# Patient Record
Sex: Female | Born: 1960 | Race: White | Hispanic: No | Marital: Married | Smoking: Former smoker
Health system: Southern US, Community
[De-identification: ages and names within clinical notes are randomized; demographics above are authoritative.]

## PROBLEM LIST (undated history)

## (undated) DIAGNOSIS — I10 Essential (primary) hypertension: Secondary | ICD-10-CM

## (undated) DIAGNOSIS — J449 Chronic obstructive pulmonary disease, unspecified: Secondary | ICD-10-CM

## (undated) DIAGNOSIS — IMO0002 Reserved for concepts with insufficient information to code with codable children: Secondary | ICD-10-CM

## (undated) DIAGNOSIS — G43909 Migraine, unspecified, not intractable, without status migrainosus: Secondary | ICD-10-CM

## (undated) DIAGNOSIS — E785 Hyperlipidemia, unspecified: Secondary | ICD-10-CM

## (undated) DIAGNOSIS — E079 Disorder of thyroid, unspecified: Secondary | ICD-10-CM

## (undated) DIAGNOSIS — K760 Fatty (change of) liver, not elsewhere classified: Secondary | ICD-10-CM

## (undated) DIAGNOSIS — M1990 Unspecified osteoarthritis, unspecified site: Secondary | ICD-10-CM

## (undated) DIAGNOSIS — K219 Gastro-esophageal reflux disease without esophagitis: Secondary | ICD-10-CM

## (undated) DIAGNOSIS — C801 Malignant (primary) neoplasm, unspecified: Secondary | ICD-10-CM

## (undated) DIAGNOSIS — M2550 Pain in unspecified joint: Secondary | ICD-10-CM

## (undated) DIAGNOSIS — M329 Systemic lupus erythematosus, unspecified: Secondary | ICD-10-CM

## (undated) DIAGNOSIS — J45909 Unspecified asthma, uncomplicated: Secondary | ICD-10-CM

## (undated) HISTORY — PX: TUBAL LIGATION: SHX77

## (undated) HISTORY — PX: SQUAMOUS CELL CARCINOMA EXCISION: SHX2433

## (undated) HISTORY — DX: Essential (primary) hypertension: I10

## (undated) HISTORY — DX: Unspecified osteoarthritis, unspecified site: M19.90

## (undated) HISTORY — PX: TRIGGER FINGER RELEASE: SHX641

## (undated) HISTORY — DX: Pain in unspecified joint: M25.50

## (undated) HISTORY — DX: Gastro-esophageal reflux disease without esophagitis: K21.9

## (undated) HISTORY — DX: Hyperlipidemia, unspecified: E78.5

## (undated) HISTORY — DX: Fatty (change of) liver, not elsewhere classified: K76.0

## (undated) HISTORY — DX: Migraine, unspecified, not intractable, without status migrainosus: G43.909

---

## 2010-09-30 ENCOUNTER — Ambulatory Visit: Payer: Self-pay | Admitting: Physician Assistant

## 2010-10-28 ENCOUNTER — Ambulatory Visit: Payer: Self-pay | Admitting: Physician Assistant

## 2011-01-22 ENCOUNTER — Ambulatory Visit: Payer: Self-pay | Admitting: Adult Health

## 2012-01-12 ENCOUNTER — Ambulatory Visit: Payer: Self-pay | Admitting: "Endocrinology

## 2014-07-05 ENCOUNTER — Ambulatory Visit: Payer: Self-pay | Admitting: Internal Medicine

## 2015-05-22 ENCOUNTER — Other Ambulatory Visit: Payer: Self-pay

## 2015-05-29 ENCOUNTER — Ambulatory Visit: Payer: Self-pay | Admitting: Internal Medicine

## 2015-08-15 ENCOUNTER — Ambulatory Visit: Payer: Self-pay

## 2015-12-11 ENCOUNTER — Other Ambulatory Visit: Payer: Self-pay

## 2015-12-11 LAB — LIPID PANEL
CHOLESTEROL: 225 mg/dL — AB (ref 0–200)
HDL: 62 mg/dL (ref 35–70)
LDL Cholesterol: 139 mg/dL
TRIGLYCERIDES: 118 mg/dL (ref 40–160)

## 2015-12-11 LAB — BASIC METABOLIC PANEL
BUN: 16 mg/dL (ref 4–21)
CREATININE: 0.9 mg/dL (ref 0.5–1.1)
Glucose: 92 mg/dL
Sodium: 147 mmol/L (ref 137–147)

## 2015-12-11 LAB — HEMOGLOBIN A1C: HEMOGLOBIN A1C: 6

## 2015-12-11 LAB — HEPATIC FUNCTION PANEL
ALT: 25 U/L (ref 7–35)
AST: 15 U/L (ref 13–35)
Alkaline Phosphatase: 112 U/L (ref 25–125)
Bilirubin, Total: 0.2 mg/dL

## 2015-12-11 LAB — CBC AND DIFFERENTIAL
NEUTROS ABS: 4 /uL
WBC: 6.5 10^3/mL

## 2015-12-11 LAB — TSH: TSH: 2.95 u[IU]/mL (ref 0.41–5.90)

## 2015-12-18 ENCOUNTER — Ambulatory Visit: Payer: Self-pay | Admitting: Internal Medicine

## 2016-01-01 ENCOUNTER — Ambulatory Visit: Payer: Self-pay | Admitting: Internal Medicine

## 2016-01-01 DIAGNOSIS — J449 Chronic obstructive pulmonary disease, unspecified: Secondary | ICD-10-CM | POA: Insufficient documentation

## 2016-01-01 DIAGNOSIS — J45909 Unspecified asthma, uncomplicated: Secondary | ICD-10-CM | POA: Insufficient documentation

## 2016-01-01 DIAGNOSIS — M329 Systemic lupus erythematosus, unspecified: Secondary | ICD-10-CM | POA: Insufficient documentation

## 2016-01-01 DIAGNOSIS — E039 Hypothyroidism, unspecified: Secondary | ICD-10-CM | POA: Insufficient documentation

## 2016-01-01 DIAGNOSIS — IMO0002 Reserved for concepts with insufficient information to code with codable children: Secondary | ICD-10-CM | POA: Insufficient documentation

## 2016-01-01 DIAGNOSIS — K219 Gastro-esophageal reflux disease without esophagitis: Secondary | ICD-10-CM | POA: Insufficient documentation

## 2016-01-22 ENCOUNTER — Encounter (INDEPENDENT_AMBULATORY_CARE_PROVIDER_SITE_OTHER): Payer: Self-pay

## 2016-01-23 ENCOUNTER — Other Ambulatory Visit: Payer: Self-pay

## 2016-01-28 ENCOUNTER — Other Ambulatory Visit: Payer: Self-pay

## 2016-01-30 ENCOUNTER — Other Ambulatory Visit
Admission: RE | Admit: 2016-01-30 | Discharge: 2016-01-30 | Disposition: A | Discharge status: Home / Self Care | Payer: Self-pay | Source: Ambulatory Visit | Attending: Nurse Practitioner | Admitting: Nurse Practitioner

## 2016-01-30 DIAGNOSIS — Z029 Encounter for administrative examinations, unspecified: Secondary | ICD-10-CM | POA: Insufficient documentation

## 2016-02-03 LAB — QUANTIFERON IN TUBE
QFT TB AG MINUS NIL VALUE: 0.01 IU/mL
QUANTIFERON MITOGEN VALUE: 7.9 IU/mL
QUANTIFERON TB AG VALUE: 0.08 IU/mL
QUANTIFERON TB GOLD: NEGATIVE
Quantiferon Nil Value: 0.07 IU/mL

## 2016-02-03 LAB — QUANTIFERON TB GOLD ASSAY (BLOOD)

## 2016-02-20 ENCOUNTER — Ambulatory Visit: Payer: Self-pay | Admitting: Nurse Practitioner

## 2016-02-20 VITALS — BP 125/71 | HR 81 | Ht 64.0 in | Wt 204.0 lb

## 2016-02-20 DIAGNOSIS — J0100 Acute maxillary sinusitis, unspecified: Secondary | ICD-10-CM

## 2016-02-20 MED ORDER — CEFDINIR 300 MG PO CAPS: 300.0000 mg | ORAL_CAPSULE | Freq: Two times a day (BID) | ORAL | Status: DC

## 2016-02-20 NOTE — Progress Notes (Signed)
   Subjective:    Patient ID: Isabel Jimenez, female    DOB: [birth year], 55 y.o.   MRN: [id number]  HPI Report of purulent nasal drainage x 2 weeks, worsening over the last week, now with productive cough of green sputum.  History of severe asthma, history of lupus, getting ready to start methotrexate, waiting to start until sinus infection is resolved.     Review of Systems  Constitutional: Negative for fever.  HENT: Positive for postnasal drip, rhinorrhea and sinus pressure.   Respiratory: Positive for shortness of breath and wheezing.        Objective:   Physical Exam  HENT:  Positive for r frontal and maxillary sinus pain.  Positive for purulent nasal drainage.  Cardiovascular: Normal rate, regular rhythm and normal heart sounds.   Pulmonary/Chest: She has wheezes.  BBrS, markedly diminished, as expected in long term smoker, with copd and asthma          Assessment & Plan:  Pranjal was seen today for facial pain.  Diagnoses and all orders for this visit:  Acute maxillary sinusitis, recurrence not specified Comments: will treat with cefdinir 300 mg bid for 14 days.  pt encouraged to drink copious fluids, and use saline nasal spray

## 2016-03-03 ENCOUNTER — Encounter: Payer: Self-pay | Admitting: Emergency Medicine

## 2016-03-03 ENCOUNTER — Emergency Department
Admission: EM | Admit: 2016-03-03 | Discharge: 2016-03-03 | Disposition: A | Discharge status: Home / Self Care | Payer: Self-pay | Attending: Emergency Medicine | Admitting: Emergency Medicine

## 2016-03-03 DIAGNOSIS — Z7951 Long term (current) use of inhaled steroids: Secondary | ICD-10-CM | POA: Insufficient documentation

## 2016-03-03 DIAGNOSIS — J449 Chronic obstructive pulmonary disease, unspecified: Secondary | ICD-10-CM | POA: Insufficient documentation

## 2016-03-03 DIAGNOSIS — J029 Acute pharyngitis, unspecified: Secondary | ICD-10-CM | POA: Insufficient documentation

## 2016-03-03 DIAGNOSIS — Z792 Long term (current) use of antibiotics: Secondary | ICD-10-CM | POA: Insufficient documentation

## 2016-03-03 DIAGNOSIS — F17210 Nicotine dependence, cigarettes, uncomplicated: Secondary | ICD-10-CM | POA: Insufficient documentation

## 2016-03-03 DIAGNOSIS — Z79899 Other long term (current) drug therapy: Secondary | ICD-10-CM | POA: Insufficient documentation

## 2016-03-03 HISTORY — DX: Systemic lupus erythematosus, unspecified: M32.9

## 2016-03-03 HISTORY — DX: Disorder of thyroid, unspecified: E07.9

## 2016-03-03 HISTORY — DX: Unspecified asthma, uncomplicated: J45.909

## 2016-03-03 HISTORY — DX: Reserved for concepts with insufficient information to code with codable children: IMO0002

## 2016-03-03 HISTORY — DX: Chronic obstructive pulmonary disease, unspecified: J44.9

## 2016-03-03 LAB — BASIC METABOLIC PANEL
ANION GAP: 6 (ref 5–15)
BUN: 15 mg/dL (ref 6–20)
CO2: 27 mmol/L (ref 22–32)
Calcium: 8.9 mg/dL (ref 8.9–10.3)
Chloride: 106 mmol/L (ref 101–111)
Creatinine, Ser: 0.94 mg/dL (ref 0.44–1.00)
GLUCOSE: 88 mg/dL (ref 65–99)
POTASSIUM: 4.1 mmol/L (ref 3.5–5.1)
SODIUM: 139 mmol/L (ref 135–145)

## 2016-03-03 LAB — CBC
HCT: 40.7 % (ref 35.0–47.0)
Hemoglobin: 13.7 g/dL (ref 12.0–16.0)
MCH: 29.6 pg (ref 26.0–34.0)
MCHC: 33.7 g/dL (ref 32.0–36.0)
MCV: 87.8 fL (ref 80.0–100.0)
PLATELETS: 290 10*3/uL (ref 150–440)
RBC: 4.63 MIL/uL (ref 3.80–5.20)
RDW: 14.4 % (ref 11.5–14.5)
WBC: 5 10*3/uL (ref 3.6–11.0)

## 2016-03-03 NOTE — ED Provider Notes (Signed)
[facility] Emergency Department Provider Note ____________________________________________  Time seen: 1140  I have reviewed the triage vital signs and the nursing notes.  HISTORY  Chief Complaint  Sore Throat  HPI Isabel Jimenez is a 55 y.o. female presents to the ED for evaluation of what she assumes to be a swollen gland the left side of her neck since Thursday. She notes some pain intermittently with swallowing. She denies any interim fevers, chills, sweats. The patient is currently on day 11 of a 14 day course of cefdinir. She is being treated aggressively by primary care provider for sinus infection, prior to starting her methotrexate therapy. She denies any difficulty with swallowing, controlling secretions or breathing difficulty.   Past Medical History  Diagnosis Date  . Lupus ([place])   . COPD (chronic obstructive pulmonary disease) ([place])   . Thyroid disease   . Asthma     There are no active problems to display for this patient.   Past Surgical History  Procedure Laterality Date  . Tubal ligation      Current Outpatient Rx  Name  Route  Sig  Dispense  Refill  . albuterol (PROVENTIL HFA;VENTOLIN HFA) 108 (90 Base) MCG/ACT inhaler   Inhalation   Inhale 1-2 puffs into the lungs every 4 (four) hours as needed for wheezing or shortness of breath.         Marland Kitchen albuterol (PROVENTIL) (2.5 MG/3ML) 0.083% nebulizer solution   Nebulization   Take 2.5 mg by nebulization every 6 (six) hours as needed for wheezing or shortness of breath.         . B Complex Vitamins (B COMPLEX PO)   Oral   Take by mouth daily.         . cefdinir (OMNICEF) 300 MG capsule   Oral   Take 1 capsule (300 mg total) by mouth 2 (two) times daily.   28 capsule   0   . docusate sodium (COLACE) 100 MG capsule   Oral   Take 200 mg by mouth daily.         . fluticasone (FLONASE) 50 MCG/ACT nasal spray   Each Nare   Place 1 spray into both nostrils daily.         .  Fluticasone-Salmeterol (ADVAIR) 500-50 MCG/DOSE AEPB   Inhalation   Inhale 1 puff into the lungs 2 (two) times daily.         . folic acid (FOLVITE) 1 MG tablet   Oral   Take 1 mg by mouth daily.         Marland Kitchen levothyroxine (SYNTHROID, LEVOTHROID) 100 MCG tablet   Oral   Take 100 mcg by mouth daily before breakfast.         . mometasone (ELOCON) 0.1 % ointment   Topical   Apply topically as needed.         Marland Kitchen omeprazole (PRILOSEC) 40 MG capsule   Oral   Take 40 mg by mouth daily.         Marland Kitchen tiotropium (SPIRIVA) 18 MCG inhalation capsule   Inhalation   Place 18 mcg into inhaler and inhale daily.         Marland Kitchen triamcinolone cream (KENALOG) 0.1 %   Topical   Apply 1 application topically as needed.          Allergies Chantix and Codeine  No family history on file.  Social History Social History  Substance Use Topics  . Smoking status: Current Every Day  Smoker -- 0.50 packs/day for 45 years    Types: Cigarettes  . Smokeless tobacco: None  . Alcohol Use: 0.0 oz/week    0 Standard drinks or equivalent per week   Review of Systems  Constitutional: Negative for fever. Eyes: Negative for visual changes. ENT: Positive for sore throat. Cardiovascular: Negative for chest pain. Respiratory: Negative for shortness of breath. Gastrointestinal: Negative for abdominal pain, vomiting and diarrhea. Genitourinary: Negative for dysuria. Musculoskeletal: Negative for back pain. Skin: Negative for rash. Neurological: Negative for headaches, focal weakness or numbness. ____________________________________________  PHYSICAL EXAM:  VITAL SIGNS: ED Triage Vitals  Enc Vitals Group     BP 03/03/16 0946 149/97 mmHg     Pulse Rate 03/03/16 0946 73     Resp 03/03/16 0946 18     Temp 03/03/16 0946 98.6 F (37 C)     Temp src --      SpO2 03/03/16 0946 94 %     Weight 03/03/16 0946 204 lb (92.534 kg)     Height 03/03/16 0946 5\' 4"  (1.626 m)     Head Cir --      Peak Flow --       Pain Score 03/03/16 0946 8     Pain Loc --      Pain Edu? --      Excl. in [place]? --    Constitutional: Alert and oriented. Well appearing and in no distress. Head: Normocephalic and atraumatic.      Eyes: Conjunctivae are normal. PERRL. Normal extraocular movements      Ears: Canals clear. TMs intact bilaterally.   Nose: No congestion/rhinorrhea.   Mouth/Throat: Mucous membranes are moist.   Neck: Supple. No thyromegaly. No soft tissue swelling, edema, or induration. Minimal tenderness to the left lateral neck without fullness or palpable nodule, node, or abscess.  Hematological/Lymphatic/Immunological: No cervical lymphadenopathy. Cardiovascular: Normal rate, regular rhythm.  Respiratory: Normal respiratory effort. No wheezes/rales/rhonchi. Gastrointestinal: Soft and nontender. No distention. Musculoskeletal: Nontender with normal range of motion in all extremities.  Neurologic:  Normal gait without ataxia. Normal speech and language. No gross focal neurologic deficits are appreciated. Skin:  Skin is warm, dry and intact. No rash noted. Psychiatric: Mood and affect are normal. Patient exhibits appropriate insight and judgment. ____________________________________________  INITIAL IMPRESSION / ASSESSMENT AND PLAN / ED COURSE  Patient with intermittent throat pain since starting cefdinir. Her exam is normal without evidence of airway obstruction or angioedema. She is reassured about her symptoms. She will complete the course and follow-up with your provider. She will return to the ED as needed.  ____________________________________________  FINAL CLINICAL IMPRESSION(S) / ED DIAGNOSES  Final diagnoses:  Sore throat      Melvenia Needles, PA-C 03/03/16 1649  Earleen Newport, MD 03/04/16 [phone]

## 2016-03-03 NOTE — Discharge Instructions (Signed)
Sore Throat A sore throat is a painful, burning, sore, or scratchy feeling of the throat. There may be pain or tenderness when swallowing or talking. You may have other symptoms with a sore throat. These include coughing, sneezing, fever, or a swollen neck. A sore throat is often the first sign of another sickness. These sicknesses may include a cold, flu, strep throat, or an infection called mono. Most sore throats go away without medical treatment.  HOME CARE   Only take medicine as told by your doctor.  Drink enough fluids to keep your pee (urine) clear or pale yellow.  Rest as needed.  Try using throat sprays, lozenges, or suck on hard candy (if older than 4 years or as told).  Sip warm liquids, such as broth, herbal tea, or warm water with honey. Try sucking on frozen ice pops or drinking cold liquids.  Rinse the mouth (gargle) with salt water. Mix 1 teaspoon salt with 8 ounces of water.  Do not smoke. Avoid being around others when they are smoking.  Put a humidifier in your bedroom at night to moisten the air. You can also turn on a hot shower and sit in the bathroom for 5-10 minutes. Be sure the bathroom door is closed. GET HELP RIGHT AWAY IF:   You have trouble breathing.  You cannot swallow fluids, soft foods, or your spit (saliva).  You have more puffiness (swelling) in the throat.  Your sore throat does not get better in 7 days.  You feel sick to your stomach (nauseous) and throw up (vomit).  You have a fever or lasting symptoms for more than 2-3 days.  You have a fever and your symptoms suddenly get worse. MAKE SURE YOU:   Understand these instructions.  Will watch your condition.  Will get help right away if you are not doing well or get worse.   This information is not intended to replace advice given to you by your health care provider. Make sure you discuss any questions you have with your health care provider.   Document Released: 09/01/2008 Document  Revised: 08/17/2012 Document Reviewed: 07/31/2012 Elsevier Interactive Patient Education [employer].  Your exam is normal today. You may have a mild case of throat irritation or esophagitis due to your antibiotic. Continue to monitor symptoms. Dose you reflux medicine and Tylenol or Ibuprofen as needed. Follow-up with [facility] as needed.

## 2016-03-03 NOTE — ED Notes (Signed)
See triage note  Placed on antibiotic for sinus infection.  Then developed a swollen gland to left side of neck on Thursday  Feels like it is getting worse. Increased pain with swallowing  Afebrile on arrival

## 2016-03-03 NOTE — ED Notes (Signed)
Pt states she has a swollen node or nodule on the left side of her throat/neck. Was being treated for sinus infection, and feels the antibiotic might be causing the swollen area. States it is painful, pt tearful in triage. Hx of COPD. Cough noted.

## 2016-03-09 DIAGNOSIS — K219 Gastro-esophageal reflux disease without esophagitis: Secondary | ICD-10-CM

## 2016-03-09 DIAGNOSIS — J45909 Unspecified asthma, uncomplicated: Secondary | ICD-10-CM

## 2016-03-09 DIAGNOSIS — M329 Systemic lupus erythematosus, unspecified: Secondary | ICD-10-CM

## 2016-03-09 DIAGNOSIS — IMO0002 Reserved for concepts with insufficient information to code with codable children: Secondary | ICD-10-CM

## 2016-03-09 DIAGNOSIS — J449 Chronic obstructive pulmonary disease, unspecified: Secondary | ICD-10-CM

## 2016-03-09 DIAGNOSIS — E039 Hypothyroidism, unspecified: Secondary | ICD-10-CM

## 2016-03-14 ENCOUNTER — Emergency Department: Payer: Self-pay

## 2016-03-14 ENCOUNTER — Encounter: Payer: Self-pay | Admitting: Medical Oncology

## 2016-03-14 ENCOUNTER — Emergency Department
Admission: EM | Admit: 2016-03-14 | Discharge: 2016-03-14 | Disposition: A | Discharge status: Home / Self Care | Payer: Self-pay | Attending: Emergency Medicine | Admitting: Emergency Medicine

## 2016-03-14 DIAGNOSIS — F17210 Nicotine dependence, cigarettes, uncomplicated: Secondary | ICD-10-CM | POA: Insufficient documentation

## 2016-03-14 DIAGNOSIS — E039 Hypothyroidism, unspecified: Secondary | ICD-10-CM | POA: Insufficient documentation

## 2016-03-14 DIAGNOSIS — J441 Chronic obstructive pulmonary disease with (acute) exacerbation: Secondary | ICD-10-CM | POA: Insufficient documentation

## 2016-03-14 DIAGNOSIS — K30 Functional dyspepsia: Secondary | ICD-10-CM | POA: Insufficient documentation

## 2016-03-14 DIAGNOSIS — I509 Heart failure, unspecified: Secondary | ICD-10-CM | POA: Insufficient documentation

## 2016-03-14 DIAGNOSIS — Z85828 Personal history of other malignant neoplasm of skin: Secondary | ICD-10-CM | POA: Insufficient documentation

## 2016-03-14 HISTORY — DX: Malignant (primary) neoplasm, unspecified: C80.1

## 2016-03-14 LAB — CBC
HCT: 40.4 % (ref 35.0–47.0)
Hemoglobin: 13.6 g/dL (ref 12.0–16.0)
MCH: 29.5 pg (ref 26.0–34.0)
MCHC: 33.7 g/dL (ref 32.0–36.0)
MCV: 87.5 fL (ref 80.0–100.0)
PLATELETS: 285 10*3/uL (ref 150–440)
RBC: 4.61 MIL/uL (ref 3.80–5.20)
RDW: 14.6 % — ABNORMAL HIGH (ref 11.5–14.5)
WBC: 5.5 10*3/uL (ref 3.6–11.0)

## 2016-03-14 LAB — BASIC METABOLIC PANEL
ANION GAP: 7 (ref 5–15)
BUN: 9 mg/dL (ref 6–20)
CALCIUM: 8.1 mg/dL — AB (ref 8.9–10.3)
CO2: 23 mmol/L (ref 22–32)
Chloride: 103 mmol/L (ref 101–111)
Creatinine, Ser: 0.77 mg/dL (ref 0.44–1.00)
Glucose, Bld: 92 mg/dL (ref 65–99)
Potassium: 3.4 mmol/L — ABNORMAL LOW (ref 3.5–5.1)
SODIUM: 133 mmol/L — AB (ref 135–145)

## 2016-03-14 LAB — TROPONIN I

## 2016-03-14 MED ORDER — PREDNISONE 20 MG PO TABS
60.0000 mg | ORAL_TABLET | Freq: Once | ORAL | Status: AC
  Administered 2016-03-14: 60 mg via ORAL
  Filled 2016-03-14: qty 3

## 2016-03-14 MED ORDER — PREDNISONE 10 MG PO TABS: ORAL_TABLET | ORAL | Status: DC

## 2016-03-14 MED ORDER — IPRATROPIUM-ALBUTEROL 0.5-2.5 (3) MG/3ML IN SOLN
9.0000 mL | Freq: Once | RESPIRATORY_TRACT | Status: AC
  Administered 2016-03-14: 9 mL via RESPIRATORY_TRACT
  Filled 2016-03-14: qty 9

## 2016-03-14 MED ORDER — DIPHENHYDRAMINE HCL 25 MG PO CAPS: 50.0000 mg | ORAL_CAPSULE | Freq: Four times a day (QID) | ORAL | Status: DC | PRN

## 2016-03-14 MED ORDER — AZITHROMYCIN 250 MG PO TABS: ORAL_TABLET | ORAL | Status: DC

## 2016-03-14 MED ORDER — IOPAMIDOL (ISOVUE-370) INJECTION 76%
100.0000 mL | Freq: Once | INTRAVENOUS | Status: AC | PRN
  Administered 2016-03-14: 100 mL via INTRAVENOUS

## 2016-03-14 MED ORDER — SODIUM CHLORIDE 0.9 % IV BOLUS (SEPSIS)
1000.0000 mL | Freq: Once | INTRAVENOUS | Status: AC
  Administered 2016-03-14: 1000 mL via INTRAVENOUS

## 2016-03-14 NOTE — Discharge Instructions (Signed)

## 2016-03-14 NOTE — ED Notes (Signed)
Pt reports starting a new medication last week (methotrexate). Ever since starting the medicine she reports it is really affecting her breathing.

## 2016-03-14 NOTE — ED Provider Notes (Signed)
[facility] Emergency Department Provider Note  ____________________________________________  Time seen: 1:00 PM  I have reviewed the triage vital signs and the nursing notes.   HISTORY  Chief Complaint Shortness of Breath    HPI Isabel Jimenez is a 55 y.o. female who complains of shortness of breath this started this morning. Worse with exertion. Feels like her asthma and COPD are both worse.  She took her second dose of methotrexate which she is prescribed for lupus yesterday. Last week was her first dose, and that they following the administration she felt more short of breath. This was short-lived and resolved on its own. However, since yesterday her breathing feels much worse again. Denies any skin lesions or rash, throat swelling, vomiting. Symptoms are isolated to shortness of breath and wheezing. Has a nonproductive cough as well. Also reports some central aching chest pain that is nonradiating and hurts worse with breathing and coughing. Denies a history of DVT or PE or recent travel, hospitalizations or surgeries.     Past Medical History  Diagnosis Date  . Lupus ([place])   . COPD (chronic obstructive pulmonary disease) ([place])   . Thyroid disease   . Asthma   . CHF (congestive heart failure) ([place])   . Migraines   . Joint pain   . Cancer [facility])     skin     Patient Active Problem List   Diagnosis Date Noted  . Lupus ([place]) 01/01/2016  . Hypothyroidism 01/01/2016  . Acid reflux 01/01/2016  . COPD (chronic obstructive pulmonary disease) ([place]) 01/01/2016  . Asthma 01/01/2016     Past Surgical History  Procedure Laterality Date  . Tubal ligation    . Squamous cell carcinoma excision      Left Shoulder     Current Outpatient Rx  Name  Route  Sig  Dispense  Refill  . albuterol (PROVENTIL HFA;VENTOLIN HFA) 108 (90 Base) MCG/ACT inhaler   Inhalation   Inhale 1-2 puffs into the lungs every 4 (four) hours as needed for wheezing or shortness  of breath.         Marland Kitchen albuterol (PROVENTIL) (2.5 MG/3ML) 0.083% nebulizer solution   Nebulization   Take 2.5 mg by nebulization every 6 (six) hours as needed for wheezing or shortness of breath.         Marland Kitchen azithromycin (ZITHROMAX Z-PAK) 250 MG tablet      Take 2 tablets (500 mg) on  Day 1,  followed by 1 tablet (250 mg) once daily on Days 2 through 5.   6 each   0   . B Complex Vitamins (B COMPLEX PO)   Oral   Take by mouth daily.         . cefdinir (OMNICEF) 300 MG capsule   Oral   Take 1 capsule (300 mg total) by mouth 2 (two) times daily.   28 capsule   0   . diphenhydrAMINE (BENADRYL) 25 mg capsule   Oral   Take 2 capsules (50 mg total) by mouth every 6 (six) hours as needed.   60 capsule   0   . docusate sodium (COLACE) 100 MG capsule   Oral   Take 200 mg by mouth daily.         . fluticasone (FLONASE) 50 MCG/ACT nasal spray   Each Nare   Place 1 spray into both nostrils daily.         . Fluticasone-Salmeterol (ADVAIR) 500-50 MCG/DOSE AEPB   Inhalation  Inhale 1 puff into the lungs 2 (two) times daily.         . folic acid (FOLVITE) 1 MG tablet   Oral   Take 1 mg by mouth daily.         Marland Kitchen levothyroxine (SYNTHROID, LEVOTHROID) 100 MCG tablet   Oral   Take 100 mcg by mouth daily before breakfast.         . mometasone (ELOCON) 0.1 % ointment   Topical   Apply topically as needed.         Marland Kitchen omeprazole (PRILOSEC) 40 MG capsule   Oral   Take 40 mg by mouth daily.         . predniSONE (DELTASONE) 10 MG tablet      Take five tablets a day (50 mg) for 3 days,  Then four tablets a day (40 mg) for 3 days  Then three tablets a day (30 mg) for 3 days  Then two tablets a day (20 mg) for 5 days  Then one tablet a day (10 mg) for 5 days   51 tablet   0   . tiotropium (SPIRIVA) 18 MCG inhalation capsule   Inhalation   Place 18 mcg into inhaler and inhale daily.         Marland Kitchen triamcinolone cream (KENALOG) 0.1 %   Topical   Apply 1  application topically as needed.            Allergies Cefdinir; Chantix; and Codeine   Family History  Problem Relation Age of Onset  . Cancer Mother     Breast  . Cancer Father     Colon  . Renal Disease Father     Social History Social History  Substance Use Topics  . Smoking status: Current Every Day Smoker -- 0.50 packs/day for 45 years    Types: Cigarettes  . Smokeless tobacco: None  . Alcohol Use: 0.0 oz/week    0 Standard drinks or equivalent per week    Review of Systems  Constitutional:   No fever or chills. No weight changes Eyes:   No vision changes.  ENT:   No sore throat. No rhinorrhea. Cardiovascular:   Positive as above chest pain. Respiratory:   Shortness of breath and nonproductive cough. Gastrointestinal:   Negative for abdominal pain, vomiting and diarrhea.  No BRBPR or melena. Genitourinary:   Negative for dysuria or difficulty urinating. Musculoskeletal:   Negative for focal pain or swelling Skin:   Negative for rash. Neurological:   Negative for headaches, focal weakness or numbness.  10-point ROS otherwise negative.  ____________________________________________   PHYSICAL EXAM:  VITAL SIGNS: ED Triage Vitals  Enc Vitals Group     BP 03/14/16 1045 163/85 mmHg     Pulse Rate 03/14/16 1045 85     Resp 03/14/16 1045 22     Temp 03/14/16 1045 98.1 F (36.7 C)     Temp Source 03/14/16 1045 Oral     SpO2 03/14/16 1045 91 %     Weight 03/14/16 1045 204 lb (92.534 kg)     Height --      Head Cir --      Peak Flow --      Pain Score 03/14/16 1045 0     Pain Loc --      Pain Edu? --      Excl. in [place]? --     Vital signs reviewed, nursing assessments reviewed.   Constitutional:   Alert and oriented.  Well appearing and in no distress. Good spirits Eyes:   No scleral icterus. No conjunctival pallor. PERRL. EOMI ENT   Head:   Normocephalic and atraumatic.   Nose:   No congestion/rhinnorhea. No septal hematoma    Mouth/Throat:   MMM, mild pharyngeal erythema. No peritonsillar mass.    Neck:   No stridor. No SubQ emphysema. No meningismus. Hematological/Lymphatic/Immunilogical:   No cervical lymphadenopathy. Cardiovascular:   RRR. Symmetric bilateral radial and DP pulses.  No murmurs.  Respiratory:   Diffuse expiratory wheezing. Mildly prolonged expiratory phase. No focal consolidative findings.. Gastrointestinal:   Soft and nontender. Non distended. There is no CVA tenderness.  No rebound, rigidity, or guarding. Genitourinary:   deferred Musculoskeletal:   Nontender with normal range of motion in all extremities. No joint effusions.  No lower extremity tenderness.  No edema. Neurologic:   Normal speech and language.  CN 2-10 normal. Motor grossly intact. No gross focal neurologic deficits are appreciated.  Skin:    Skin is warm, dry and intact. No rash noted.  No petechiae, purpura, or bullae. Psychiatric:   Mood and affect are normal. ____________________________________________    LABS (pertinent positives/negatives) (all labs ordered are listed, but only abnormal results are displayed) Labs Reviewed  BASIC METABOLIC PANEL - Abnormal; Notable for the following:    Sodium 133 (*)    Potassium 3.4 (*)    Calcium 8.1 (*)    All other components within normal limits  CBC - Abnormal; Notable for the following:    RDW 14.6 (*)    All other components within normal limits  TROPONIN I   ____________________________________________   EKG  Interpreted by me  Date: 03/14/2016  Rate: 70  Rhythm: normal sinus rhythm  QRS Axis: normal  Intervals: normal  ST/T Wave abnormalities: normal  Conduction Disutrbances: none  Narrative Interpretation: unremarkable      ____________________________________________    RADIOLOGY  Chest x-ray unremarkable CT angiogram of the chest  unremarkable  ____________________________________________   PROCEDURES   ____________________________________________   INITIAL IMPRESSION / ASSESSMENT AND PLAN / ED COURSE  Pertinent labs & imaging results that were available during my care of the patient were reviewed by me and considered in my medical decision making (see chart for details).  Patient presents with shortness of breath and wheezing. With lupus patient is at an elevated risk of PE so with the shortness of breath and started chest pain we proceeded with a CT angiogram of the chest. This was unremarkable. The patient does not have hypoxia and other vital signs are unremarkable. This appears to be COPD exacerbation. She's feeling a little bit better after steroids and nebs. She has nebulizer at home as well. Low suspicion for dissection or ACS. No evidence of pneumonia or sepsis at present. We'll continue the patient on a steroid taper and continue bronchodilators at home. Also start her on azithromycin due to the immune suppression that she has with steroids and methotrexate and lupus. Current presentation not consistent with anaphylaxis but I have instructed her to stop taking methotrexate as she may be having some degree of allergic reaction to it. We'll also have her take Benadryl for symptom control.       ____________________________________________   FINAL CLINICAL IMPRESSION(S) / ED DIAGNOSES  Final diagnoses:  COPD with acute exacerbation ([place])      Carrie Mew, MD 03/14/16 [phone]

## 2016-03-14 NOTE — ED Notes (Signed)
Patient has saturation of 91% on RA

## 2016-03-14 NOTE — ED Notes (Signed)
Pt reports she began having sob this am, denies chest pain. Pt appears anxious in triage, pt is tearful. Pt reports she was started on methotrexate recently for lupus and she took a dose last night and thinks this could be due to new med. Pt was placed on O2 2L in triage, RA sats were 91%.

## 2016-04-07 ENCOUNTER — Encounter: Payer: Self-pay | Admitting: Emergency Medicine

## 2016-04-07 ENCOUNTER — Emergency Department: Payer: Self-pay

## 2016-04-07 ENCOUNTER — Emergency Department
Admission: EM | Admit: 2016-04-07 | Discharge: 2016-04-07 | Disposition: A | Discharge status: Home / Self Care | Payer: Self-pay | Attending: Emergency Medicine | Admitting: Emergency Medicine

## 2016-04-07 DIAGNOSIS — I509 Heart failure, unspecified: Secondary | ICD-10-CM | POA: Insufficient documentation

## 2016-04-07 DIAGNOSIS — J441 Chronic obstructive pulmonary disease with (acute) exacerbation: Secondary | ICD-10-CM | POA: Insufficient documentation

## 2016-04-07 DIAGNOSIS — J45909 Unspecified asthma, uncomplicated: Secondary | ICD-10-CM | POA: Insufficient documentation

## 2016-04-07 DIAGNOSIS — Z85828 Personal history of other malignant neoplasm of skin: Secondary | ICD-10-CM | POA: Insufficient documentation

## 2016-04-07 DIAGNOSIS — F17210 Nicotine dependence, cigarettes, uncomplicated: Secondary | ICD-10-CM | POA: Insufficient documentation

## 2016-04-07 DIAGNOSIS — Z79899 Other long term (current) drug therapy: Secondary | ICD-10-CM | POA: Insufficient documentation

## 2016-04-07 DIAGNOSIS — E039 Hypothyroidism, unspecified: Secondary | ICD-10-CM | POA: Insufficient documentation

## 2016-04-07 LAB — BASIC METABOLIC PANEL
Anion gap: 8 (ref 5–15)
BUN: 17 mg/dL (ref 6–20)
CO2: 27 mmol/L (ref 22–32)
CREATININE: 0.85 mg/dL (ref 0.44–1.00)
Calcium: 8.8 mg/dL — ABNORMAL LOW (ref 8.9–10.3)
Chloride: 107 mmol/L (ref 101–111)
GFR calc Af Amer: >60 mL/min (ref >60)
Glucose, Bld: 99 mg/dL (ref 65–99)
Potassium: 3.8 mmol/L (ref 3.5–5.1)
SODIUM: 142 mmol/L (ref 135–145)

## 2016-04-07 LAB — BRAIN NATRIURETIC PEPTIDE: B NATRIURETIC PEPTIDE 5: 23 pg/mL (ref 0.0–100.0)

## 2016-04-07 LAB — CBC
HEMATOCRIT: 40.1 % (ref 35.0–47.0)
HEMOGLOBIN: 13.4 g/dL (ref 12.0–16.0)
MCH: 29.5 pg (ref 26.0–34.0)
MCHC: 33.5 g/dL (ref 32.0–36.0)
MCV: 88.1 fL (ref 80.0–100.0)
Platelets: 273 10*3/uL (ref 150–440)
RBC: 4.55 MIL/uL (ref 3.80–5.20)
RDW: 14.7 % — ABNORMAL HIGH (ref 11.5–14.5)
WBC: 6 10*3/uL (ref 3.6–11.0)

## 2016-04-07 MED ORDER — AZITHROMYCIN 250 MG PO TABS: 500.0000 mg | ORAL_TABLET | Freq: Once | ORAL | Status: DC

## 2016-04-07 MED ORDER — PREDNISONE 20 MG PO TABS: 60.0000 mg | ORAL_TABLET | Freq: Every day | ORAL | Status: DC

## 2016-04-07 MED ORDER — AZITHROMYCIN 500 MG PO TABS
500.0000 mg | ORAL_TABLET | Freq: Once | ORAL | Status: AC
  Administered 2016-04-07: 500 mg via ORAL
  Filled 2016-04-07: qty 1

## 2016-04-07 MED ORDER — METHYLPREDNISOLONE SODIUM SUCC 125 MG IJ SOLR
125.0000 mg | Freq: Once | INTRAMUSCULAR | Status: AC
  Administered 2016-04-07: 125 mg via INTRAVENOUS
  Filled 2016-04-07: qty 2

## 2016-04-07 MED ORDER — IPRATROPIUM-ALBUTEROL 0.5-2.5 (3) MG/3ML IN SOLN
3.0000 mL | Freq: Once | RESPIRATORY_TRACT | Status: AC
  Administered 2016-04-07: 3 mL via RESPIRATORY_TRACT
  Filled 2016-04-07: qty 3

## 2016-04-07 NOTE — Discharge Instructions (Signed)
Please continue to use your albuterol nebulizer and your albuterol inhaler with spacer as needed. Take the entire course of steroids and antibiotics, even if you're feeling better. Please make an appointment with your primary care physician for close follow-up.  Return to the emergency department if you develop worsening shortness of breath, fever, lightheadedness or fainting, chest pain, or any other symptoms concerning to you.

## 2016-04-07 NOTE — ED Notes (Signed)
Pt states she wears 2 L at night, but for the past few days she has been wearing 3L continuously.  Pt states she has used nebs and inhaler lately, neb has been helping more than the inhaler.

## 2016-04-07 NOTE — ED Provider Notes (Signed)
[facility] Emergency Department Provider Note  ____________________________________________  Time seen: Approximately 9:51 AM  I have reviewed the triage vital signs and the nursing notes.   HISTORY  Chief Complaint Cough    HPI Isabel Jimenez is a 55 y.o. female with a history of COPD and asthma as well as ongoing tobacco abuse, CHF, presenting for shortness of breath. The patient reports that at baseline she wears 2 L nasal cannula at night. Over the past week, she has had progressively worsening shortness of breath and is now wearing oxygen during the day occasionally as well as using 3 L nasal cannula at night. She has worsening shortness of breath with exertion. She has had a nonproductive cough with some mild congestion but no rhinorrhea, sore throat or ear pain. No fever or chills. She is not had any chest pain, palpitations, or lower extremity swelling. Her symptoms improved with the albuterol nebulizer.   Past Medical History  Diagnosis Date  . Lupus ([place])   . COPD (chronic obstructive pulmonary disease) ([place])   . Thyroid disease   . Asthma   . CHF (congestive heart failure) ([place])   . Migraines   . Joint pain   . Cancer [facility])     skin    Patient Active Problem List   Diagnosis Date Noted  . Lupus ([place]) 01/01/2016  . Hypothyroidism 01/01/2016  . Acid reflux 01/01/2016  . COPD (chronic obstructive pulmonary disease) ([place]) 01/01/2016  . Asthma 01/01/2016    Past Surgical History  Procedure Laterality Date  . Tubal ligation    . Squamous cell carcinoma excision      Left Shoulder    Current Outpatient Rx  Name  Route  Sig  Dispense  Refill  . albuterol (PROVENTIL HFA;VENTOLIN HFA) 108 (90 Base) MCG/ACT inhaler   Inhalation   Inhale 1-2 puffs into the lungs every 4 (four) hours as needed for wheezing or shortness of breath.         Marland Kitchen albuterol (PROVENTIL) (2.5 MG/3ML) 0.083% nebulizer solution   Nebulization   Take 2.5 mg by  nebulization every 6 (six) hours as needed for wheezing or shortness of breath.         Marland Kitchen azithromycin (ZITHROMAX) 250 MG tablet   Oral   Take 2 tablets (500 mg total) by mouth once.   4 tablet   0   . B Complex Vitamins (B COMPLEX PO)   Oral   Take by mouth daily.         . cefdinir (OMNICEF) 300 MG capsule   Oral   Take 1 capsule (300 mg total) by mouth 2 (two) times daily.   28 capsule   0   . diphenhydrAMINE (BENADRYL) 25 mg capsule   Oral   Take 2 capsules (50 mg total) by mouth every 6 (six) hours as needed.   60 capsule   0   . docusate sodium (COLACE) 100 MG capsule   Oral   Take 200 mg by mouth daily.         . fluticasone (FLONASE) 50 MCG/ACT nasal spray   Each Nare   Place 1 spray into both nostrils daily.         . Fluticasone-Salmeterol (ADVAIR) 500-50 MCG/DOSE AEPB   Inhalation   Inhale 1 puff into the lungs 2 (two) times daily.         . folic acid (FOLVITE) 1 MG tablet   Oral   Take 1 mg by  mouth daily.         Marland Kitchen levothyroxine (SYNTHROID, LEVOTHROID) 100 MCG tablet   Oral   Take 100 mcg by mouth daily before breakfast.         . mometasone (ELOCON) 0.1 % ointment   Topical   Apply topically as needed.         Marland Kitchen omeprazole (PRILOSEC) 40 MG capsule   Oral   Take 40 mg by mouth daily.         . predniSONE (DELTASONE) 20 MG tablet   Oral   Take 3 tablets (60 mg total) by mouth daily.   15 tablet   0   . tiotropium (SPIRIVA) 18 MCG inhalation capsule   Inhalation   Place 18 mcg into inhaler and inhale daily.         Marland Kitchen triamcinolone cream (KENALOG) 0.1 %   Topical   Apply 1 application topically as needed.           Allergies Methotrexate derivatives; Cefdinir; Chantix; Codeine; and Plaquenil  Family History  Problem Relation Age of Onset  . Cancer Mother     Breast  . Cancer Father     Colon  . Renal Disease Father     Social History Social History  Substance Use Topics  . Smoking status: Current Every  Day Smoker -- 0.50 packs/day for 45 years    Types: Cigarettes  . Smokeless tobacco: None  . Alcohol Use: 0.0 oz/week    0 Standard drinks or equivalent per week    Review of Systems Constitutional: No fever/chills. No lightheadedness or syncope. Eyes: No visual changes. ENT: No sore throat. Positive congestion without rhinorrhea. Cardiovascular: Denies chest pain. Denies palpitations. Respiratory: Positive shortness of breath.  Positive nonproductive cough. Gastrointestinal: No abdominal pain.  No nausea, no vomiting.  No diarrhea.  No constipation. Genitourinary: Negative for dysuria. Musculoskeletal: Negative for back pain. No lower extremity swelling. No calf pain. Skin: Negative for rash. Neurological: Negative for headaches. No focal numbness, tingling or weakness.   10-point ROS otherwise negative.  ____________________________________________   PHYSICAL EXAM:  VITAL SIGNS: ED Triage Vitals  Enc Vitals Group     BP 04/07/16 0935 129/77 mmHg     Pulse Rate 04/07/16 0935 80     Resp 04/07/16 0935 20     Temp 04/07/16 0935 97.9 F (36.6 C)     Temp Source 04/07/16 0935 Oral     SpO2 04/07/16 0935 87 %     Weight 04/07/16 0935 199 lb (90.266 kg)     Height 04/07/16 0935 5\' 4"  (1.626 m)     Head Cir --      Peak Flow --      Pain Score 04/07/16 0935 4     Pain Loc --      Pain Edu? --      Excl. in [place]? --     Constitutional: Alert and oriented. Chronically ill appearing and in no acute distress. Answers questions appropriately. Eyes: Conjunctivae are normal.  EOMI. No scleral icterus. Head: Atraumatic. Nose: No congestion/rhinnorhea. Mouth/Throat: Mucous membranes are moist.  Neck: No stridor.  Supple.  No JVD. Cardiovascular: Normal rate, regular rhythm. No murmurs, rubs or gallops.  Respiratory: Mild tachypnea without accessory muscle use or retractions. Able to speak in full sentences. Mild end expiratory wheezing with fair air exchange in the bilateral lung  fields. No rales or rhonchi. Gastrointestinal: Soft, nontender and nondistended.  No guarding or rebound.  No peritoneal  signs. Musculoskeletal: No LE edema. No ttp in the calves or palpable cords.  Negative Homan's sign. Neurologic:  A&Ox3.  Speech is clear.  Face and smile are symmetric.  EOMI.  Moves all extremities well. Skin:  Skin is warm, dry and intact. No rash noted. Psychiatric: Mood and affect are normal. Speech and behavior are normal.  Normal judgement.  ____________________________________________   LABS (all labs ordered are listed, but only abnormal results are displayed)  Labs Reviewed  CBC - Abnormal; Notable for the following:    RDW 14.7 (*)    All other components within normal limits  BASIC METABOLIC PANEL - Abnormal; Notable for the following:    Calcium 8.8 (*)    All other components within normal limits  BRAIN NATRIURETIC PEPTIDE   ____________________________________________  EKG  ED ECG REPORT I, Eula Listen, the attending physician, personally viewed and interpreted this ECG.   Date: 04/07/2016  EKG Time: 1005  Rate: 69  Rhythm: normal sinus rhythm  Axis: normal  Intervals:none  ST&T Change: Nonspecific T-wave inversion in V1. No ST elevation. Positive PVC.  ____________________________________________  RADIOLOGY  Dg Chest 2 View  04/07/2016  CLINICAL DATA:  Shortness of breath, productive cough EXAM: CHEST  2 VIEW COMPARISON:  03/14/2016 FINDINGS: Cardiomediastinal silhouette is stable. No acute infiltrate or pleural effusion. No pulmonary edema. Stable mild degenerative changes mid thoracic spine. IMPRESSION: No active cardiopulmonary disease. Electronically Signed   By: Lahoma Crocker M.D.   On: 04/07/2016 10:49    ____________________________________________   PROCEDURES  Procedure(s) performed: None  Critical Care performed: No ____________________________________________   INITIAL IMPRESSION / ASSESSMENT AND PLAN / ED  COURSE  Pertinent labs & imaging results that were available during my care of the patient were reviewed by me and considered in my medical decision making (see chart for details).  55 y.o. with a history of COPD and asthma as well as ongoing tobacco abuse, CHF, presenting with 1 week of progressively worsening shortness of breath, decreased exercise tolerance, and nonproductive cough. The most likely etiology of the patient's symptom is a COPD exacerbation. I will get a chest x-ray to rule out pneumonia but the patient has not been having systemic symptoms that would be suggestive of this. PE is much less likely. I will give the patient a breathing treatment, steroids and antibiotics, and reevaluate her.  ----------------------------------------- 11:03 AM on 04/07/2016 -----------------------------------------  The patient is feeling better and has maintain oxygen saturation of greater than 95% on 2 L nasal cannula. Her chest x-ray does not show pneumonia. Will plan to discharge her home with treatment for COPD exacerbation, with close PMD follow-up. She understand return precautions as well as follow-up instructions.  ____________________________________________  FINAL CLINICAL IMPRESSION(S) / ED DIAGNOSES  Final diagnoses:  COPD exacerbation ([place])      NEW MEDICATIONS STARTED DURING THIS VISIT:  New Prescriptions   AZITHROMYCIN (ZITHROMAX) 250 MG TABLET    Take 2 tablets (500 mg total) by mouth once.   PREDNISONE (DELTASONE) 20 MG TABLET    Take 3 tablets (60 mg total) by mouth daily.     Eula Listen, MD 04/07/16 1103

## 2016-04-07 NOTE — ED Notes (Signed)
Pt to ed with c/o sob, cough x several days.  Pt states coughing up white sputum.

## 2016-04-17 ENCOUNTER — Encounter: Payer: Self-pay | Admitting: Emergency Medicine

## 2016-04-17 ENCOUNTER — Emergency Department: Payer: Self-pay

## 2016-04-17 ENCOUNTER — Other Ambulatory Visit: Payer: Self-pay

## 2016-04-17 ENCOUNTER — Inpatient Hospital Stay
Admission: EM | Admit: 2016-04-17 | Discharge: 2016-04-20 | DRG: 190 | Disposition: A | Discharge status: Home / Self Care | Payer: Self-pay | Attending: Internal Medicine | Admitting: Internal Medicine

## 2016-04-17 DIAGNOSIS — E039 Hypothyroidism, unspecified: Secondary | ICD-10-CM | POA: Diagnosis present

## 2016-04-17 DIAGNOSIS — Z9981 Dependence on supplemental oxygen: Secondary | ICD-10-CM

## 2016-04-17 DIAGNOSIS — Z79899 Other long term (current) drug therapy: Secondary | ICD-10-CM

## 2016-04-17 DIAGNOSIS — Z800 Family history of malignant neoplasm of digestive organs: Secondary | ICD-10-CM

## 2016-04-17 DIAGNOSIS — R739 Hyperglycemia, unspecified: Secondary | ICD-10-CM | POA: Diagnosis present

## 2016-04-17 DIAGNOSIS — Z841 Family history of disorders of kidney and ureter: Secondary | ICD-10-CM

## 2016-04-17 DIAGNOSIS — Z885 Allergy status to narcotic agent status: Secondary | ICD-10-CM

## 2016-04-17 DIAGNOSIS — J9621 Acute and chronic respiratory failure with hypoxia: Secondary | ICD-10-CM | POA: Diagnosis present

## 2016-04-17 DIAGNOSIS — M329 Systemic lupus erythematosus, unspecified: Secondary | ICD-10-CM | POA: Diagnosis present

## 2016-04-17 DIAGNOSIS — I509 Heart failure, unspecified: Secondary | ICD-10-CM | POA: Diagnosis present

## 2016-04-17 DIAGNOSIS — K219 Gastro-esophageal reflux disease without esophagitis: Secondary | ICD-10-CM | POA: Diagnosis present

## 2016-04-17 DIAGNOSIS — Z716 Tobacco abuse counseling: Secondary | ICD-10-CM

## 2016-04-17 DIAGNOSIS — J441 Chronic obstructive pulmonary disease with (acute) exacerbation: Secondary | ICD-10-CM | POA: Diagnosis present

## 2016-04-17 DIAGNOSIS — F17210 Nicotine dependence, cigarettes, uncomplicated: Secondary | ICD-10-CM | POA: Diagnosis present

## 2016-04-17 DIAGNOSIS — F419 Anxiety disorder, unspecified: Secondary | ICD-10-CM | POA: Diagnosis present

## 2016-04-17 DIAGNOSIS — Z803 Family history of malignant neoplasm of breast: Secondary | ICD-10-CM

## 2016-04-17 DIAGNOSIS — Z888 Allergy status to other drugs, medicaments and biological substances status: Secondary | ICD-10-CM

## 2016-04-17 DIAGNOSIS — J209 Acute bronchitis, unspecified: Secondary | ICD-10-CM | POA: Diagnosis present

## 2016-04-17 DIAGNOSIS — R0602 Shortness of breath: Secondary | ICD-10-CM

## 2016-04-17 DIAGNOSIS — J440 Chronic obstructive pulmonary disease with acute lower respiratory infection: Principal | ICD-10-CM | POA: Diagnosis present

## 2016-04-17 DIAGNOSIS — J449 Chronic obstructive pulmonary disease, unspecified: Secondary | ICD-10-CM

## 2016-04-17 DIAGNOSIS — J45909 Unspecified asthma, uncomplicated: Secondary | ICD-10-CM | POA: Diagnosis present

## 2016-04-17 DIAGNOSIS — T380X5A Adverse effect of glucocorticoids and synthetic analogues, initial encounter: Secondary | ICD-10-CM | POA: Diagnosis present

## 2016-04-17 DIAGNOSIS — E079 Disorder of thyroid, unspecified: Secondary | ICD-10-CM | POA: Diagnosis present

## 2016-04-17 DIAGNOSIS — Z7952 Long term (current) use of systemic steroids: Secondary | ICD-10-CM

## 2016-04-17 LAB — BASIC METABOLIC PANEL
ANION GAP: 8 (ref 5–15)
BUN: 17 mg/dL (ref 6–20)
CALCIUM: 8.8 mg/dL — AB (ref 8.9–10.3)
CO2: 28 mmol/L (ref 22–32)
Chloride: 105 mmol/L (ref 101–111)
Creatinine, Ser: 0.67 mg/dL (ref 0.44–1.00)
GFR calc Af Amer: >60 mL/min (ref >60)
Glucose, Bld: 119 mg/dL — ABNORMAL HIGH (ref 65–99)
POTASSIUM: 3.9 mmol/L (ref 3.5–5.1)
SODIUM: 141 mmol/L (ref 135–145)

## 2016-04-17 LAB — TROPONIN I

## 2016-04-17 LAB — CBC
HCT: 42.3 % (ref 35.0–47.0)
Hemoglobin: 14.1 g/dL (ref 12.0–16.0)
MCH: 30.1 pg (ref 26.0–34.0)
MCHC: 33.3 g/dL (ref 32.0–36.0)
MCV: 90.4 fL (ref 80.0–100.0)
Platelets: 269 10*3/uL (ref 150–440)
RBC: 4.68 MIL/uL (ref 3.80–5.20)
RDW: 14.5 % (ref 11.5–14.5)
WBC: 8.2 10*3/uL (ref 3.6–11.0)

## 2016-04-17 LAB — GLUCOSE, CAPILLARY: GLUCOSE-CAPILLARY: 165 mg/dL — AB (ref 65–99)

## 2016-04-17 MED ORDER — METHYLPREDNISOLONE SODIUM SUCC 125 MG IJ SOLR
INTRAMUSCULAR | Status: AC
  Administered 2016-04-17: 125 mg via INTRAVENOUS
  Filled 2016-04-17: qty 2

## 2016-04-17 MED ORDER — LEVOTHYROXINE SODIUM 100 MCG PO TABS
100.0000 ug | ORAL_TABLET | Freq: Every day | ORAL | Status: DC
  Administered 2016-04-18 – 2016-04-20 (×3): 100 ug via ORAL
  Filled 2016-04-17 (×3): qty 1

## 2016-04-17 MED ORDER — IPRATROPIUM-ALBUTEROL 0.5-2.5 (3) MG/3ML IN SOLN
3.0000 mL | Freq: Once | RESPIRATORY_TRACT | Status: AC
  Administered 2016-04-17: 3 mL via RESPIRATORY_TRACT

## 2016-04-17 MED ORDER — IPRATROPIUM-ALBUTEROL 0.5-2.5 (3) MG/3ML IN SOLN
3.0000 mL | Freq: Once | RESPIRATORY_TRACT | Status: DC
  Administered 2016-04-17: 3 mL via RESPIRATORY_TRACT

## 2016-04-17 MED ORDER — ACETAMINOPHEN 325 MG PO TABS
650.0000 mg | ORAL_TABLET | Freq: Four times a day (QID) | ORAL | Status: DC | PRN
  Administered 2016-04-18 – 2016-04-20 (×3): 650 mg via ORAL
  Filled 2016-04-17 (×3): qty 2

## 2016-04-17 MED ORDER — METHYLPREDNISOLONE SODIUM SUCC 125 MG IJ SOLR
60.0000 mg | Freq: Four times a day (QID) | INTRAMUSCULAR | Status: DC
  Administered 2016-04-18 – 2016-04-20 (×9): 60 mg via INTRAVENOUS
  Filled 2016-04-17 (×10): qty 2

## 2016-04-17 MED ORDER — DEXTROSE 5 % IV SOLN
500.0000 mg | Freq: Once | INTRAVENOUS | Status: AC
  Administered 2016-04-17: 500 mg via INTRAVENOUS
  Filled 2016-04-17: qty 500

## 2016-04-17 MED ORDER — MOMETASONE FUROATE 0.1 % EX OINT
TOPICAL_OINTMENT | CUTANEOUS | Status: DC | PRN
  Filled 2016-04-17: qty 15

## 2016-04-17 MED ORDER — SODIUM CHLORIDE 0.9% FLUSH
3.0000 mL | Freq: Two times a day (BID) | INTRAVENOUS | Status: DC
  Administered 2016-04-18 – 2016-04-20 (×6): 3 mL via INTRAVENOUS

## 2016-04-17 MED ORDER — MAGNESIUM SULFATE 2 GM/50ML IV SOLN
2.0000 g | Freq: Once | INTRAVENOUS | Status: AC
  Administered 2016-04-17: 2 g via INTRAVENOUS
  Filled 2016-04-17: qty 50

## 2016-04-17 MED ORDER — ENOXAPARIN SODIUM 40 MG/0.4ML ~~LOC~~ SOLN
40.0000 mg | Freq: Every day | SUBCUTANEOUS | Status: DC
  Administered 2016-04-18 – 2016-04-19 (×3): 40 mg via SUBCUTANEOUS
  Filled 2016-04-17 (×3): qty 0.4

## 2016-04-17 MED ORDER — ALBUTEROL SULFATE (2.5 MG/3ML) 0.083% IN NEBU
5.0000 mg | INHALATION_SOLUTION | Freq: Once | RESPIRATORY_TRACT | Status: AC
  Administered 2016-04-17: 5 mg via RESPIRATORY_TRACT

## 2016-04-17 MED ORDER — ACETAMINOPHEN 650 MG RE SUPP: 650.0000 mg | Freq: Four times a day (QID) | RECTAL | Status: DC | PRN

## 2016-04-17 MED ORDER — IPRATROPIUM-ALBUTEROL 0.5-2.5 (3) MG/3ML IN SOLN
RESPIRATORY_TRACT | Status: AC
  Administered 2016-04-17: 3 mL via RESPIRATORY_TRACT
  Filled 2016-04-17: qty 6

## 2016-04-17 MED ORDER — ONDANSETRON HCL 4 MG PO TABS: 4.0000 mg | ORAL_TABLET | Freq: Four times a day (QID) | ORAL | Status: DC | PRN

## 2016-04-17 MED ORDER — PANTOPRAZOLE SODIUM 40 MG PO TBEC
40.0000 mg | DELAYED_RELEASE_TABLET | Freq: Every day | ORAL | Status: DC
  Administered 2016-04-18 – 2016-04-20 (×3): 40 mg via ORAL
  Filled 2016-04-17 (×3): qty 1

## 2016-04-17 MED ORDER — ONDANSETRON HCL 4 MG/2ML IJ SOLN: 4.0000 mg | Freq: Four times a day (QID) | INTRAMUSCULAR | Status: DC | PRN

## 2016-04-17 MED ORDER — DEXTROSE 5 % IV SOLN
500.0000 mg | INTRAVENOUS | Status: DC
  Administered 2016-04-18 – 2016-04-19 (×2): 500 mg via INTRAVENOUS
  Filled 2016-04-17 (×3): qty 500

## 2016-04-17 MED ORDER — IPRATROPIUM-ALBUTEROL 0.5-2.5 (3) MG/3ML IN SOLN
3.0000 mL | Freq: Four times a day (QID) | RESPIRATORY_TRACT | Status: DC | PRN
  Administered 2016-04-20: 02:00:00 3 mL via RESPIRATORY_TRACT
  Filled 2016-04-17: qty 3

## 2016-04-17 MED ORDER — TRIAMCINOLONE ACETONIDE 0.1 % EX CREA
1.0000 "application " | TOPICAL_CREAM | CUTANEOUS | Status: DC | PRN
  Filled 2016-04-17: qty 15

## 2016-04-17 MED ORDER — IPRATROPIUM-ALBUTEROL 0.5-2.5 (3) MG/3ML IN SOLN
3.0000 mL | RESPIRATORY_TRACT | Status: DC
  Administered 2016-04-18 – 2016-04-19 (×12): 3 mL via RESPIRATORY_TRACT
  Filled 2016-04-17 (×12): qty 3

## 2016-04-17 MED ORDER — METHYLPREDNISOLONE SODIUM SUCC 125 MG IJ SOLR
125.0000 mg | Freq: Once | INTRAMUSCULAR | Status: AC
  Administered 2016-04-17: 125 mg via INTRAVENOUS

## 2016-04-17 MED ORDER — MOMETASONE FURO-FORMOTEROL FUM 200-5 MCG/ACT IN AERO
2.0000 | INHALATION_SPRAY | Freq: Two times a day (BID) | RESPIRATORY_TRACT | Status: DC
  Administered 2016-04-18 – 2016-04-20 (×6): 2 via RESPIRATORY_TRACT
  Filled 2016-04-17: qty 8.8

## 2016-04-17 MED ORDER — ALBUTEROL SULFATE (2.5 MG/3ML) 0.083% IN NEBU
INHALATION_SOLUTION | RESPIRATORY_TRACT | Status: AC
Start: 2016-04-17 — End: 2016-04-18
  Filled 2016-04-17: qty 6

## 2016-04-17 MED ORDER — TIOTROPIUM BROMIDE MONOHYDRATE 18 MCG IN CAPS
18.0000 ug | ORAL_CAPSULE | Freq: Every day | RESPIRATORY_TRACT | Status: DC
  Administered 2016-04-18 – 2016-04-20 (×3): 18 ug via RESPIRATORY_TRACT
  Filled 2016-04-17: qty 5

## 2016-04-17 MED ORDER — IPRATROPIUM-ALBUTEROL 0.5-2.5 (3) MG/3ML IN SOLN
RESPIRATORY_TRACT | Status: AC
  Filled 2016-04-17: qty 3

## 2016-04-17 NOTE — ED Notes (Signed)
Pt placed back on bipap per dr. Archie Balboa request

## 2016-04-17 NOTE — ED Notes (Signed)
Per Dr. Archie Balboa, bipap removed and placed of 4L [place] to see how pt tolerates.

## 2016-04-17 NOTE — ED Provider Notes (Signed)
[facility] Emergency Department Provider Note    ____________________________________________  Time seen: ~1940  I have reviewed the triage vital signs and the nursing notes.   HISTORY  Chief Complaint Shortness of Breath   History limited by: Not Limited   HPI Isabel Jimenez is a 55 y.o. female with history of COPD who presents to the emergency department today because of concerns for worsening shortness of breath. She states that this episode started yesterday. She states she was just sitting around her house when it started. It has been progressively gotten worse. She describes some associated chest tightness with this. She has had an intermittent nonproductive cough. She states that this feels like her normal COPD exacerbations however somewhat worse. She has been trying her neb treatments at home without relief. She was seen earlier this month in the emergency department for similar symptoms. She states that she thinks that her lungs have not been the same since she was put on methotrexate for lupus. She denies any recent fevers.   Past Medical History  Diagnosis Date  . Lupus ([place])   . COPD (chronic obstructive pulmonary disease) ([place])   . Thyroid disease   . Asthma   . CHF (congestive heart failure) ([place])   . Migraines   . Joint pain   . Cancer [facility])     skin    Patient Active Problem List   Diagnosis Date Noted  . Lupus ([place]) 01/01/2016  . Hypothyroidism 01/01/2016  . Acid reflux 01/01/2016  . COPD (chronic obstructive pulmonary disease) ([place]) 01/01/2016  . Asthma 01/01/2016    Past Surgical History  Procedure Laterality Date  . Tubal ligation    . Squamous cell carcinoma excision      Left Shoulder    Current Outpatient Rx  Name  Route  Sig  Dispense  Refill  . albuterol (PROVENTIL HFA;VENTOLIN HFA) 108 (90 Base) MCG/ACT inhaler   Inhalation   Inhale 1-2 puffs into the lungs every 4 (four) hours as needed for wheezing or  shortness of breath.         Marland Kitchen albuterol (PROVENTIL) (2.5 MG/3ML) 0.083% nebulizer solution   Nebulization   Take 2.5 mg by nebulization every 6 (six) hours as needed for wheezing or shortness of breath.         . diphenhydrAMINE (BENADRYL) 25 mg capsule   Oral   Take 2 capsules (50 mg total) by mouth every 6 (six) hours as needed.   60 capsule   0   . docusate sodium (COLACE) 100 MG capsule   Oral   Take 200 mg by mouth daily.         . fluticasone (FLONASE) 50 MCG/ACT nasal spray   Each Nare   Place 1 spray into both nostrils daily.         . Fluticasone-Salmeterol (ADVAIR) 500-50 MCG/DOSE AEPB   Inhalation   Inhale 1 puff into the lungs 2 (two) times daily.         Marland Kitchen levothyroxine (SYNTHROID, LEVOTHROID) 100 MCG tablet   Oral   Take 100 mcg by mouth daily before breakfast.         . mometasone (ELOCON) 0.1 % ointment   Topical   Apply topically as needed.         Marland Kitchen omeprazole (PRILOSEC) 40 MG capsule   Oral   Take 40 mg by mouth daily.         Marland Kitchen tiotropium (SPIRIVA) 18 MCG inhalation capsule  Inhalation   Place 18 mcg into inhaler and inhale daily.         Marland Kitchen triamcinolone cream (KENALOG) 0.1 %   Topical   Apply 1 application topically as needed.         Marland Kitchen azithromycin (ZITHROMAX) 250 MG tablet   Oral   Take 2 tablets (500 mg total) by mouth once.   4 tablet   0   . B Complex Vitamins (B COMPLEX PO)   Oral   Take by mouth daily.         . cefdinir (OMNICEF) 300 MG capsule   Oral   Take 1 capsule (300 mg total) by mouth 2 (two) times daily.   28 capsule   0   . folic acid (FOLVITE) 1 MG tablet   Oral   Take 1 mg by mouth daily.         . predniSONE (DELTASONE) 20 MG tablet   Oral   Take 3 tablets (60 mg total) by mouth daily.   15 tablet   0     Allergies Methotrexate derivatives; Cefdinir; Chantix; Codeine; and Plaquenil  Family History  Problem Relation Age of Onset  . Cancer Mother     Breast  . Cancer Father      Colon  . Renal Disease Father     Social History Social History  Substance Use Topics  . Smoking status: Current Every Day Smoker -- 0.50 packs/day for 45 years    Types: Cigarettes  . Smokeless tobacco: None  . Alcohol Use: 0.0 oz/week    0 Standard drinks or equivalent per week    Review of Systems  Constitutional: Negative for fever. Cardiovascular: Positive for chest tightness Respiratory: Positive for shortness of breath. Gastrointestinal: Negative for abdominal pain, vomiting and diarrhea. Neurological: Negative for headaches, focal weakness or numbness.  10-point ROS otherwise negative.  ____________________________________________   PHYSICAL EXAM:  VITAL SIGNS: ED Triage Vitals  Enc Vitals Group     BP 04/17/16 1845 128/70 mmHg     Pulse Rate 04/17/16 1845 88     Resp 04/17/16 1845 22     Temp 04/17/16 1845 98.3 F (36.8 C)     Temp Source 04/17/16 1845 Oral     SpO2 04/17/16 1845 86 %     Weight 04/17/16 1845 199 lb (90.266 kg)     Height 04/17/16 1845 5\' 4"  (1.626 m)     Head Cir --      Peak Flow --      Pain Score 04/17/16 1849 2   Constitutional: Alert and oriented. Appears in moderate respiratory distress. Eyes: Conjunctivae are normal. PERRL. Normal extraocular movements. ENT   Head: Normocephalic and atraumatic.   Nose: No congestion/rhinnorhea.   Mouth/Throat: Mucous membranes are moist.   Neck: No stridor. Hematological/Lymphatic/Immunilogical: No cervical lymphadenopathy. Cardiovascular: Normal rate, regular rhythm.  No murmurs, rubs, or gallops. Respiratory: Moderate respiratory distress. Almost no air movement appreciated in bilateral lung fields. Some air movement noted in the upper right lung. Prolonged expiratory phase. Gastrointestinal: Soft and nontender. No distention.  Genitourinary: Deferred Musculoskeletal: Normal range of motion in all extremities. No joint effusions.  No lower extremity tenderness nor  edema. Neurologic:  Normal speech and language. No gross focal neurologic deficits are appreciated.  Skin:  Skin is warm, dry and intact. No rash noted. Psychiatric: Mood and affect are normal. Speech and behavior are normal. Patient exhibits appropriate insight and judgment.  ____________________________________________    LABS (pertinent  positives/negatives)  Labs Reviewed  BASIC METABOLIC PANEL - Abnormal; Notable for the following:    Glucose, Bld 119 (*)    Calcium 8.8 (*)    All other components within normal limits  CBC  TROPONIN I     ____________________________________________   EKG  I, Nance Pear, attending physician, personally viewed and interpreted this EKG  EKG Time: 1910 Rate: 84 Rhythm: normal sinus rhythm Axis: normal Intervals: qtc 427 QRS: narrow, q waves V1, V2 ST changes: no st elevation Impression: abnormal ekg  ____________________________________________    RADIOLOGY  CXR  IMPRESSION: No active cardiopulmonary disease.  ____________________________________________   PROCEDURES  Procedure(s) performed: None  Critical Care performed: Yes, see critical care note(s)   CRITICAL CARE Performed by: Nance Pear   Total critical care time: 35 minutes  Critical care time was exclusive of separately billable procedures and treating other patients.  Critical care was necessary to treat or prevent imminent or life-threatening deterioration.  Critical care was time spent personally by me on the following activities: development of treatment plan with patient and/or surrogate as well as nursing, discussions with consultants, evaluation of patient's response to treatment, examination of patient, obtaining history from patient or surrogate, ordering and performing treatments and interventions, ordering and review of laboratory studies, ordering and review of radiographic studies, pulse oximetry and re-evaluation of patient's  condition.   ____________________________________________   INITIAL IMPRESSION / ASSESSMENT AND PLAN / ED COURSE  Pertinent labs & imaging results that were available during my care of the patient were reviewed by me and considered in my medical decision making (see chart for details).  Patient with history of COPD who presents to the emergency department today because of concerns for shortness of breath. On exam patient in moderate respiratory distress. Very poor air movement diffusely. Given the degree of respiratory distress while try BiPAP. Patient already received Solu-Medrol. Will give additional DuoNeb treatments.  ----------------------------------------- 8:34 PM on 04/17/2016 -----------------------------------------   Patient doing much better on BiPAP. Much calmer. States she feels better.  ----------------------------------------- 9:59 PM on 04/17/2016 -----------------------------------------  Trial patient on BiPAP. She did not do well. She had increased work of breathing. Will place patient back on BiPAP and plan admission to hospital service.  ____________________________________________   FINAL CLINICAL IMPRESSION(S) / ED DIAGNOSES  Final diagnoses:  Chronic obstructive pulmonary disease, unspecified COPD type ([place])  Shortness of breath     Nance Pear, MD 04/17/16 2200

## 2016-04-17 NOTE — ED Notes (Signed)
Patient placed on 2L O2, then breathing treatment started after sat reading of 86% on RA.

## 2016-04-17 NOTE — ED Notes (Signed)
Patient to ER for c/o shortness of breath. States she has h/o COPD and feels like her typical COPD flare. Also has h/o asthma.

## 2016-04-17 NOTE — ED Notes (Signed)
Pt reports not feeling as well as when she was on bipap.  Breathing visibly more labored.  On 4L o2, pt o2sat range from 90-95.  Pt reports when she coughs, feels like she needs to cough up sputum but unable.

## 2016-04-17 NOTE — H&P (Signed)
[facility] [place] NAME: Isabel Jimenez    MR#:  [id number]  DATE OF BIRTH:  May 26, 1961  DATE OF ADMISSION:  04/17/2016  PRIMARY CARE PHYSICIAN: Tawni Millers, MD   REQUESTING/REFERRING PHYSICIAN: Archie Balboa, MD  CHIEF COMPLAINT:   Chief Complaint  Patient presents with  . Shortness of Breath    HISTORY OF PRESENT ILLNESS:  Isabel Jimenez  is a 55 y.o. female who presents with Progressive shortness of breath and chest tightness with breathing for the past 2 days. Patient has a known history of COPD and has been hospitalized for COPD exacerbation before. She presents today with decreased O2 sats and significant increased work of breathing. She was initially placed on BiPAP in the ED with good improvement in her oxygen saturation. She was given IV steroids and IV antibiotics, try to wean off of BiPAP but still has significant work of breathing and so was left on BiPAP for now. Hospitals were called for admission.  PAST MEDICAL HISTORY:   Past Medical History  Diagnosis Date  . Lupus ([place])   . COPD (chronic obstructive pulmonary disease) ([place])   . Thyroid disease   . Asthma   . CHF (congestive heart failure) ([place])   . Migraines   . Joint pain   . Cancer ([place])     skin    PAST SURGICAL HISTORY:   Past Surgical History  Procedure Laterality Date  . Tubal ligation    . Squamous cell carcinoma excision      Left Shoulder    SOCIAL HISTORY:   Social History  Substance Use Topics  . Smoking status: Current Every Day Smoker -- 0.50 packs/day for 45 years    Types: Cigarettes  . Smokeless tobacco: Not on file  . Alcohol Use: 0.0 oz/week    0 Standard drinks or equivalent per week    FAMILY HISTORY:   Family History  Problem Relation Age of Onset  . Cancer Mother     Breast  . Cancer Father     Colon  . Renal Disease Father     DRUG ALLERGIES:   Allergies  Allergen Reactions  . Methotrexate Derivatives  Anaphylaxis  . Cefdinir   . Chantix [Varenicline]     rash  . Codeine     SOB, itching  . Plaquenil [Hydroxychloroquine] Other (See Comments)    Hair loss and rash    MEDICATIONS AT HOME:   Prior to Admission medications   Medication Sig Start Date End Date Taking? Authorizing Provider  albuterol (PROVENTIL HFA;VENTOLIN HFA) 108 (90 Base) MCG/ACT inhaler Inhale 1-2 puffs into the lungs every 4 (four) hours as needed for wheezing or shortness of breath.   Yes Historical Provider, MD  albuterol (PROVENTIL) (2.5 MG/3ML) 0.083% nebulizer solution Take 2.5 mg by nebulization every 6 (six) hours as needed for wheezing or shortness of breath.   Yes Historical Provider, MD  diphenhydrAMINE (BENADRYL) 25 mg capsule Take 2 capsules (50 mg total) by mouth every 6 (six) hours as needed. 03/14/16  Yes Carrie Mew, MD  docusate sodium (COLACE) 100 MG capsule Take 200 mg by mouth daily.   Yes Historical Provider, MD  fluticasone (FLONASE) 50 MCG/ACT nasal spray Place 1 spray into both nostrils daily.   Yes Historical Provider, MD  Fluticasone-Salmeterol (ADVAIR) 500-50 MCG/DOSE AEPB Inhale 1 puff into the lungs 2 (two) times daily.   Yes Historical Provider, MD  levothyroxine (SYNTHROID, LEVOTHROID) 100 MCG tablet Take 100  mcg by mouth daily before breakfast.   Yes Historical Provider, MD  mometasone (ELOCON) 0.1 % ointment Apply topically as needed.   Yes Historical Provider, MD  omeprazole (PRILOSEC) 40 MG capsule Take 40 mg by mouth daily.   Yes Historical Provider, MD  tiotropium (SPIRIVA) 18 MCG inhalation capsule Place 18 mcg into inhaler and inhale daily.   Yes Historical Provider, MD  triamcinolone cream (KENALOG) 0.1 % Apply 1 application topically as needed.   Yes Historical Provider, MD  azithromycin (ZITHROMAX) 250 MG tablet Take 2 tablets (500 mg total) by mouth once. 04/07/16   Eula Listen, MD  B Complex Vitamins (B COMPLEX PO) Take by mouth daily.    Historical Provider, MD   cefdinir (OMNICEF) 300 MG capsule Take 1 capsule (300 mg total) by mouth 2 (two) times daily. 02/20/16   Boyce Medici, FNP  folic acid (FOLVITE) 1 MG tablet Take 1 mg by mouth daily.    Historical Provider, MD  predniSONE (DELTASONE) 20 MG tablet Take 3 tablets (60 mg total) by mouth daily. 04/07/16   Eula Listen, MD    REVIEW OF SYSTEMS:  Review of Systems  Constitutional: Negative for fever, chills, weight loss and malaise/fatigue.  HENT: Negative for ear pain, hearing loss and tinnitus.   Eyes: Negative for blurred vision, double vision, pain and redness.  Respiratory: Positive for cough, shortness of breath and wheezing. Negative for hemoptysis.   Cardiovascular: Negative for chest pain, palpitations, orthopnea and leg swelling.  Gastrointestinal: Negative for nausea, vomiting, abdominal pain, diarrhea and constipation.  Genitourinary: Negative for dysuria, frequency and hematuria.  Musculoskeletal: Negative for back pain, joint pain and neck pain.  Skin:       No acne, rash, or lesions  Neurological: Negative for dizziness, tremors, focal weakness and weakness.  Endo/Heme/Allergies: Negative for polydipsia. Does not bruise/bleed easily.  Psychiatric/Behavioral: Negative for depression. The patient is not nervous/anxious and does not have insomnia.      VITAL SIGNS:   Filed Vitals:   04/17/16 2030 04/17/16 2100 04/17/16 2130 04/17/16 2200  BP: 129/74 109/62 127/74 137/73  Pulse: 80 71 71 76  Temp:      TempSrc:      Resp: 24 18 18 20   Height:      Weight:      SpO2: 93% 94% 93% 95%   Wt Readings from Last 3 Encounters:  04/17/16 90.266 kg (199 lb)  04/07/16 90.266 kg (199 lb)  03/14/16 92.534 kg (204 lb)    PHYSICAL EXAMINATION:  Physical Exam  Vitals reviewed. Constitutional: She is oriented to person, place, and time. She appears well-developed and well-nourished. No distress.  HENT:  Head: Normocephalic and atraumatic.  Mouth/Throat: Oropharynx is clear  and moist.  Eyes: Conjunctivae and EOM are normal. Pupils are equal, round, and reactive to light. No scleral icterus.  Neck: Normal range of motion. Neck supple. No JVD present. No thyromegaly present.  Cardiovascular: Normal rate, regular rhythm and intact distal pulses.  Exam reveals no gallop and no friction rub.   No murmur heard. Respiratory: She is in respiratory distress (mild, on BiPAP). She has wheezes. She has no rales.  GI: Soft. Bowel sounds are normal. She exhibits no distension. There is no tenderness.  Musculoskeletal: Normal range of motion. She exhibits no edema.  No arthritis, no gout  Lymphadenopathy:    She has no cervical adenopathy.  Neurological: She is alert and oriented to person, place, and time. No cranial nerve deficit.  No  dysarthria, no aphasia  Skin: Skin is warm and dry. No rash noted. No erythema.  Psychiatric: She has a normal mood and affect. Her behavior is normal. Judgment and thought content normal.    LABORATORY PANEL:   CBC  Recent Labs Lab 04/17/16 1851  WBC 8.2  HGB 14.1  HCT 42.3  PLT 269   ------------------------------------------------------------------------------------------------------------------  Chemistries   Recent Labs Lab 04/17/16 1851  NA 141  K 3.9  CL 105  CO2 28  GLUCOSE 119*  BUN 17  CREATININE 0.67  CALCIUM 8.8*   ------------------------------------------------------------------------------------------------------------------  Cardiac Enzymes  Recent Labs Lab 04/17/16 1851  TROPONINI <0.03   ------------------------------------------------------------------------------------------------------------------  RADIOLOGY:  Dg Chest 2 View  04/17/2016  CLINICAL DATA:  Shortness of breath beginning yesterday. Hypoxia. Chronic asthma and COPD. EXAM: CHEST  2 VIEW COMPARISON:  04/07/2016 and 01/11/2012 FINDINGS: The heart size and mediastinal contours are within normal limits. Both lungs are clear. The  visualized skeletal structures are unremarkable. IMPRESSION: No active cardiopulmonary disease. Electronically Signed   By: Earle Gell M.D.   On: 04/17/2016 19:44    EKG:   Orders placed or performed during the hospital encounter of 04/17/16  . ED EKG  . ED EKG    IMPRESSION AND PLAN:  Principal Problem:   COPD exacerbation ([place]) - given IV steroids, magnesium, and IV antibiotics in the ED. Lab workup largely unremarkable. Placed on BiPAP in the ED, and unable to wean this time due to work of breathing. Admit to stepdown for continued treatment. Continue home inhalers as well as scheduled and when necessary DuoNeb's in addition to above listed treatment. Active Problems:   Lupus ([place]) - continue home topical meds   Hypothyroidism - home dose thyroid replacement   GERD (gastroesophageal reflux disease) - home dose PPI  All the records are reviewed and case discussed with ED provider. Management plans discussed with the patient and/or family.  DVT PROPHYLAXIS: SubQ lovenox  GI PROPHYLAXIS: PPI  ADMISSION STATUS: Inpatient  CODE STATUS: Full Code Status History    This patient does not have a recorded code status. Please follow your organizational policy for patients in this situation.      TOTAL TIME TAKING CARE OF THIS PATIENT: 45 minutes.    Keyvin Rison [place] 04/17/2016, 10:11 PM  [facility]  Office  [phone]  CC: Primary care physician; Tawni Millers, MD

## 2016-04-18 DIAGNOSIS — J441 Chronic obstructive pulmonary disease with (acute) exacerbation: Secondary | ICD-10-CM

## 2016-04-18 LAB — BASIC METABOLIC PANEL
ANION GAP: 6 (ref 5–15)
BUN: 14 mg/dL (ref 6–20)
CALCIUM: 8.8 mg/dL — AB (ref 8.9–10.3)
CO2: 28 mmol/L (ref 22–32)
CREATININE: 0.75 mg/dL (ref 0.44–1.00)
Chloride: 107 mmol/L (ref 101–111)
GLUCOSE: 167 mg/dL — AB (ref 65–99)
Potassium: 4.1 mmol/L (ref 3.5–5.1)
Sodium: 141 mmol/L (ref 135–145)

## 2016-04-18 LAB — CBC
HCT: 39.9 % (ref 35.0–47.0)
HEMOGLOBIN: 13.4 g/dL (ref 12.0–16.0)
MCH: 30.3 pg (ref 26.0–34.0)
MCHC: 33.6 g/dL (ref 32.0–36.0)
MCV: 90.2 fL (ref 80.0–100.0)
PLATELETS: 261 10*3/uL (ref 150–440)
RBC: 4.43 MIL/uL (ref 3.80–5.20)
RDW: 14.6 % — ABNORMAL HIGH (ref 11.5–14.5)
WBC: 6.6 10*3/uL (ref 3.6–11.0)

## 2016-04-18 LAB — MRSA PCR SCREENING: MRSA BY PCR: NEGATIVE

## 2016-04-18 MED ORDER — NICOTINE 21 MG/24HR TD PT24
21.0000 mg | MEDICATED_PATCH | Freq: Every day | TRANSDERMAL | Status: DC
Start: 2016-04-18 — End: 2016-04-20
  Administered 2016-04-18 – 2016-04-20 (×3): 21 mg via TRANSDERMAL
  Filled 2016-04-18 (×3): qty 1

## 2016-04-18 MED ORDER — IBUPROFEN 400 MG PO TABS
400.0000 mg | ORAL_TABLET | Freq: Four times a day (QID) | ORAL | Status: DC | PRN
  Administered 2016-04-18: 400 mg via ORAL
  Filled 2016-04-18 (×2): qty 1

## 2016-04-18 MED ORDER — CETYLPYRIDINIUM CHLORIDE 0.05 % MT LIQD: 7.0000 mL | Freq: Two times a day (BID) | OROMUCOSAL | Status: DC

## 2016-04-18 MED ORDER — SUCRALFATE 1 GM/10ML PO SUSP
1.0000 g | Freq: Three times a day (TID) | ORAL | Status: DC
  Administered 2016-04-18 – 2016-04-20 (×7): 1 g via ORAL
  Filled 2016-04-18 (×7): qty 10

## 2016-04-18 MED ORDER — CHLORHEXIDINE GLUCONATE 0.12 % MT SOLN
15.0000 mL | Freq: Two times a day (BID) | OROMUCOSAL | Status: DC
  Administered 2016-04-18 – 2016-04-19 (×5): 15 mL via OROMUCOSAL
  Filled 2016-04-18 (×5): qty 15

## 2016-04-18 NOTE — Progress Notes (Signed)
[facility] [place] NAME: Isabel Jimenez    MR#:  [id number]  DATE OF BIRTH:  Jan 21, 1961  SUBJECTIVE:  CHIEF COMPLAINT:   Chief Complaint  Patient presents with  . Shortness of Breath  The patient is 55 year old Caucasian female with history of COPD, chronic bronchitis, ongoing tobacco abuse, chronic respiratory failure, on oxygen therapy at 2 L of oxygen through nasal cannulas at night, who presents to the hospital with complaints of shortness of breath, wheezing, cough, chest tightness. On arrival to the hospital patient. Chest x-ray revealed no pneumonia, patient was initiated on steroids, inhalation therapy, antibiotics, she feels somewhat better today.  Review of Systems  Constitutional: Negative for fever, chills and weight loss.  HENT: Negative for congestion.   Eyes: Negative for blurred vision and double vision.  Respiratory: Positive for cough, sputum production, shortness of breath and wheezing.   Cardiovascular: Negative for chest pain, palpitations, orthopnea, leg swelling and PND.  Gastrointestinal: Negative for nausea, vomiting, abdominal pain, diarrhea, constipation and blood in stool.  Genitourinary: Negative for dysuria, urgency, frequency and hematuria.  Musculoskeletal: Negative for falls.  Neurological: Negative for dizziness, tremors, focal weakness and headaches.  Endo/Heme/Allergies: Does not bruise/bleed easily.  Psychiatric/Behavioral: Negative for depression. The patient does not have insomnia.     VITAL SIGNS: Blood pressure 138/69, pulse 87, temperature 98.3 F (36.8 C), temperature source Oral, resp. rate 16, height 5\' 4"  (1.626 m), weight 90.266 kg (199 lb), SpO2 95 %.  PHYSICAL EXAMINATION:   GENERAL:  55 y.o.-year-old patient lying in the bed in mild respiratory distress, intermittently dyspneic on exertion, talking, however, able to speak long sentences.  EYES: Pupils equal, round, reactive to light  and accommodation. No scleral icterus. Extraocular muscles intact.  HEENT: Head atraumatic, normocephalic. Oropharynx and nasopharynx clear.  NECK:  Supple, no jugular venous distention. No thyroid enlargement, no tenderness.  LUNGS: Diminished breath sounds bilaterally, scattered bilateral inspiratory and expiratory wheezing, no rales,rhonchi or crepitation. Intermittent use of accessory muscles of respiration, mostly on exertion.  CARDIOVASCULAR: S1, S2 normal. No murmurs, rubs, or gallops.  ABDOMEN: Soft, nontender, nondistended. Bowel sounds present. No organomegaly or mass.  EXTREMITIES: No pedal edema, cyanosis, or clubbing.  NEUROLOGIC: Cranial nerves II through XII are intact. Muscle strength 5/5 in all extremities. Sensation intact. Gait not checked.  PSYCHIATRIC: The patient is alert and oriented x 3. Tearful SKIN: No obvious rash, lesion, or ulcer.   ORDERS/RESULTS REVIEWED:   CBC  Recent Labs Lab 04/17/16 1851 04/18/16 0529  WBC 8.2 6.6  HGB 14.1 13.4  HCT 42.3 39.9  PLT 269 261  MCV 90.4 90.2  MCH 30.1 30.3  MCHC 33.3 33.6  RDW 14.5 14.6*   ------------------------------------------------------------------------------------------------------------------  Chemistries   Recent Labs Lab 04/17/16 1851 04/18/16 0529  NA 141 141  K 3.9 4.1  CL 105 107  CO2 28 28  GLUCOSE 119* 167*  BUN 17 14  CREATININE 0.67 0.75  CALCIUM 8.8* 8.8*   ------------------------------------------------------------------------------------------------------------------ estimated creatinine clearance is 84.4 mL/min (by C-G formula based on Cr of 0.75). ------------------------------------------------------------------------------------------------------------------ No results for input(s): TSH, T4TOTAL, T3FREE, THYROIDAB in the last 72 hours.  Invalid input(s): FREET3  Cardiac Enzymes  Recent Labs Lab 04/17/16 1851  TROPONINI <0.03    ------------------------------------------------------------------------------------------------------------------ Invalid input(s): POCBNP ---------------------------------------------------------------------------------------------------------------  RADIOLOGY: Dg Chest 2 View  04/17/2016  CLINICAL DATA:  Shortness of breath beginning yesterday. Hypoxia. Chronic asthma and COPD. EXAM: CHEST  2 VIEW COMPARISON:  04/07/2016 and  01/11/2012 FINDINGS: The heart size and mediastinal contours are within normal limits. Both lungs are clear. The visualized skeletal structures are unremarkable. IMPRESSION: No active cardiopulmonary disease. Electronically Signed   By: Earle Gell M.D.   On: 04/17/2016 19:44    EKG:  Orders placed or performed during the hospital encounter of 04/17/16  . ED EKG  . ED EKG    ASSESSMENT AND PLAN:  Principal Problem:   COPD exacerbation ([place]) Active Problems:   Lupus ([place])   Hypothyroidism   GERD (gastroesophageal reflux disease) #1. Acute on chronic respiratory failure with hypoxia due to COPD exacerbation, wean off oxygen as tolerated #2. COPD exacerbation due to acute on chronic bronchitis, questionable viral, continue current therapy, get pulmonologist involved for further recommendations, follow clinically. MRSA PCR was negative #3. Hyperglycemia due to steroids, initiate patient on sliding scale insulin if needed #4. Systemic lupus erythematosus, unspecified, continue outpatient medications, now on steroids #5. Gastroesophageal reflux disease, continue PPI, add Carafate, follow clinically  Management plans discussed with the patient, family and they are in agreement.   DRUG ALLERGIES:  Allergies  Allergen Reactions  . Methotrexate Derivatives Anaphylaxis  . Cefdinir   . Chantix [Varenicline]     rash  . Codeine     SOB, itching  . Plaquenil [Hydroxychloroquine] Other (See Comments)    Hair loss and rash    CODE STATUS:     Code Status  Orders        Start     Ordered   04/17/16 2355  Full code   Continuous     04/17/16 2354    Code Status History    Date Active Date Inactive Code Status Order ID Comments User Context   This patient has a current code status but no historical code status.      TOTAL TIME TAKING CARE OF THIS PATIENT: 40 minutes.    Theodoro Grist M.D on 04/18/2016 at 2:31 PM  Between 7am to 6pm - Pager - [phone]  After 6pm go to [email or web address] - password [email or web address] [facility]  [facility]  Office  [phone]  CC: Primary care physician; Tawni Millers, MD

## 2016-04-18 NOTE — Progress Notes (Signed)
Patient transferred from CCU. Madlyn Frankel, RN

## 2016-04-18 NOTE — Progress Notes (Signed)
Pt is in stable condition at this time. Complains of mild epigastric pain, says it is acid reflux. Dr.[place] notified. New orders given. Pt is alert and oriented x 4. Family at bedside. Report attempted to be called to receiving nurse. Will call back.

## 2016-04-18 NOTE — Consult Note (Signed)
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 She states that it all started in March 2017 when she came down with a sore throat. She was given antibiotics and after taking the antibiotics her symptoms got worse and she ended up in the emergency room. She was treated and released. After the infection had resolved she was started on methotrexate for lupus. She took one dose of the medication and developed shortness of breath. She was told that it was due to the medication side effects and that her symptoms will get better over time. When she took the second dose her symptoms got worse and she ended up in the emergency room. She was treated for COPD exacerbation and released. When she went home, her symptoms progressively got worse so she decided to return to the emergency room yesterday. Upon ED arrival, she was hypoxic with oxygen saturation of 88% on room air. She was placed on oxygen via nasal cannula at 2 L, but she continued to be hypoxic, hence, she was placed on continuous BiPAP. She was admitted and being treated for acute COPD exacerbation and acute bronchitis. PCCM was consulted for further management Her CT angiogram PE study from 03/14/16 showed no PE and no pneumonia. However, there was mild bronchial wall thickening consistent with acute bronchitis. She reports significant improvement in symptoms with inhaled and systemic steroids, bronchodilators and anticolinergics. She is now on nasal cannula at 3 L and saturating between 95-100%. She denies chest pain, palpitations, nausea,  vomiting and headaches She is a current every day smoker  PAST MEDICAL HISTORY :  She  has a past medical history of Lupus ([place]); COPD (chronic obstructive pulmonary disease) ([place]); Thyroid disease; Asthma; CHF (congestive heart failure) ([place]); Migraines; Joint pain; and Cancer ([place]).  PAST SURGICAL HISTORY: She  has past surgical history that includes Tubal ligation and Squamous cell carcinoma excision.  Allergies  Allergen Reactions  . Methotrexate Derivatives Anaphylaxis  . Cefdinir   . Chantix [Varenicline]     rash  . Codeine     SOB, itching  . Plaquenil [Hydroxychloroquine] Other (See Comments)    Hair loss and rash    No current facility-administered medications on file prior to encounter.   Current Outpatient Prescriptions on File Prior to Encounter  Medication Sig  . albuterol (PROVENTIL HFA;VENTOLIN HFA) 108 (90 Base) MCG/ACT inhaler Inhale 1-2 puffs into the lungs every 4 (four) hours as needed for wheezing or shortness of breath.  Marland Kitchen albuterol (PROVENTIL) (2.5 MG/3ML) 0.083% nebulizer solution Take 2.5 mg by nebulization every 6 (six) hours as needed for wheezing or shortness of breath.  . B Complex Vitamins (B COMPLEX PO) Take by mouth daily.  . diphenhydrAMINE (BENADRYL) 25 mg capsule Take 2 capsules (50 mg total) by mouth every 6 (six) hours as needed.  . docusate sodium (COLACE) 100 MG capsule Take 200 mg by mouth daily.  . fluticasone (FLONASE) 50 MCG/ACT nasal spray Place 1 spray into both nostrils daily.  . Fluticasone-Salmeterol (ADVAIR) 500-50 MCG/DOSE AEPB Inhale 1 puff into the lungs 2 (two) times daily.  . folic acid (  FOLVITE) 1 MG tablet Take 1 mg by mouth daily.  Marland Kitchen levothyroxine (SYNTHROID, LEVOTHROID) 100 MCG tablet Take 100 mcg by mouth daily before breakfast.  . mometasone (ELOCON) 0.1 % ointment Apply topically as needed.  Marland Kitchen omeprazole (PRILOSEC) 40 MG capsule Take 40 mg by mouth daily.  . predniSONE (DELTASONE) 20 MG tablet Take 3 tablets (60 mg  total) by mouth daily.  Marland Kitchen tiotropium (SPIRIVA) 18 MCG inhalation capsule Place 18 mcg into inhaler and inhale daily.  Marland Kitchen triamcinolone cream (KENALOG) 0.1 % Apply 1 application topically as needed.  Marland Kitchen azithromycin (ZITHROMAX) 250 MG tablet Take 2 tablets (500 mg total) by mouth once. (Patient not taking: Reported on 04/17/2016)    FAMILY HISTORY:  Her has no family status information on file.   SOCIAL HISTORY: She  reports that she has been smoking Cigarettes.  She has a 22.5 pack-year smoking history. She does not have any smokeless tobacco history on file. She reports that she drinks alcohol. She reports that she does not use illicit drugs.  REVIEW OF SYSTEMS:   Constitutional: Negative for fever and chills.  HENT: Negative for congestion and rhinorrhea.  Eyes: Negative for redness and visual disturbance.  Respiratory: Positive for shortness of breath, cough, sputum production and wheezing.  Cardiovascular: Negative for chest pain and palpitations.  Gastrointestinal: Negative  for nausea , vomiting and abdominal pain and  loose stools Genitourinary: Negative for dysuria and urgency.  Musculoskeletal: Positive for myalgias but negative for arthralgias.  Skin: Negative for pallor and wound.  Neurological: Negative for dizziness and headaches   SUBJECTIVE:   VITAL SIGNS: BP 126/72 mmHg  Pulse 87  Temp(Src) 98.1 F (36.7 C) (Oral)  Resp 18  Ht 5\' 4"  (1.626 m)  Wt 199 lb (90.266 kg)  BMI 34.14 kg/m2  SpO2 91%  HEMODYNAMICS:    VENTILATOR SETTINGS: Vent Mode:  [-]  FiO2 (%):  [30 %] 30 %  INTAKE / OUTPUT: I/O last 3 completed shifts: In: 28 [P.O.:450] Out: 450 [Urine:450]  PHYSICAL EXAMINATION: General: Well-nourished, well-developed, mild respiratory distress Neuro:  Alert and oriented 4, moves all extremities, no focal deficits HEENT: Normocephalic and atraumatic, PERRLA, trachea midline, oral mucosa moist and pink Cardiovascular: Rate and rhythm regular, S1, S2  audible, no murmur, regurg or gallop Lungs: Mildly labored respirations, bilateral airflow, expiratory wheezes in all lung fields Abdomen: Nondistended, normal bowel sounds Musculoskeletal:  No visible joint deformities, positive range of motion in all extremities Skin: Warm and dry, no rash or lesions  LABS:  BMET  Recent Labs Lab 04/17/16 1851 04/18/16 0529  NA 141 141  K 3.9 4.1  CL 105 107  CO2 28 28  BUN 17 14  CREATININE 0.67 0.75  GLUCOSE 119* 167*    Electrolytes  Recent Labs Lab 04/17/16 1851 04/18/16 0529  CALCIUM 8.8* 8.8*    CBC  Recent Labs Lab 04/17/16 1851 04/18/16 0529  WBC 8.2 6.6  HGB 14.1 13.4  HCT 42.3 39.9  PLT 269 261    Coag's No results for input(s): APTT, INR in the last 168 hours.  Sepsis Markers No results for input(s): LATICACIDVEN, PROCALCITON, O2SATVEN in the last 168 hours.  ABG No results for input(s): PHART, PCO2ART, PO2ART in the last 168 hours.  Liver Enzymes No results for input(s): AST, ALT, ALKPHOS, BILITOT, ALBUMIN in the last 168 hours.  Cardiac Enzymes  Recent Labs Lab 04/17/16 1851  TROPONINI <0.03    Glucose  Recent Labs Lab 04/17/16 2358  GLUCAP 165*  Imaging Dg Chest 2 View  04/17/2016  CLINICAL DATA:  Shortness of breath beginning yesterday. Hypoxia. Chronic asthma and COPD. EXAM: CHEST  2 VIEW COMPARISON:  04/07/2016 and 01/11/2012 FINDINGS: The heart size and mediastinal contours are within normal limits. Both lungs are clear. The visualized skeletal structures are unremarkable. IMPRESSION: No active cardiopulmonary disease. Electronically Signed   By: Earle Gell M.D.   On: 04/17/2016 19:44   STUDIES:  none  CULTURES: MRSA screen negative  ANTIBIOTICS: Aithromycin  SIGNIFICANT EVENTS: 05/12> admitted with acute bronchitis and acute COPD exacerbation  LINES/TUBES: Peripheral IVs  DISCUSSION:   Pt seen and examined with NP, agree with assessment and plan: the patient is a  55 year old female with a history of O2 dependent asthma/COPD presenting with an acute on chronic respiratory failure secondary to COPD exacerbation and possible acute bronchitis.   ASSESSMENT Acute on chronic respiratory failure  Acute on chronic COPD exacerbation Acute bronchitis. Acute dyspnea Nicotine abuse.  Anxiety, likely contributing to dyspnea.   Plan -Nebulized steroids and bronchodilator -Can change steroids to oral and taper.  -Azithromycin as scheduled, continue outpatient regimen upon DC.  -Smoking cessation discussed, this is likely the most important part of her management.  -Pt asked to call our office for follow up, she receives care via charity care, I am not familiar with [place] policy on this, but I encouraged her to call and if it was feasable we could have her follow up with [place].  Rest of the treatment plan. Per primary team  Disposition and family update: Can likely discharge home next 1-2 days from respiratory standpoint.   Magdalene S. [facility] ANP-BC Pulmonary and Critical Care Medicine [facility] Pager [phone] or [phone]  04/18/2016, 11:36 PM  Marda Stalker, M.D.  04/19/2016

## 2016-04-18 NOTE — Progress Notes (Signed)
Patient is alert and oriented. Reporting no pain. Weaned from BIPAP to 3L nasal cannula with oxygen saturation WNL. Up to toilet to void this morning, tolerated well. Tolerating diet. Order to transfer to any med/surg, awaiting bed assignment. Resting quietly between care, will continue to monitor.

## 2016-04-19 DIAGNOSIS — J9621 Acute and chronic respiratory failure with hypoxia: Secondary | ICD-10-CM

## 2016-04-19 DIAGNOSIS — Z716 Tobacco abuse counseling: Secondary | ICD-10-CM

## 2016-04-19 DIAGNOSIS — J209 Acute bronchitis, unspecified: Secondary | ICD-10-CM

## 2016-04-19 MED ORDER — CHLORHEXIDINE GLUCONATE 0.12 % MT SOLN: 15.0000 mL | Freq: Two times a day (BID) | OROMUCOSAL | Status: DC

## 2016-04-19 MED ORDER — AZITHROMYCIN 250 MG PO TABS: 250.0000 mg | ORAL_TABLET | Freq: Every day | ORAL | Status: DC

## 2016-04-19 MED ORDER — IPRATROPIUM-ALBUTEROL 0.5-2.5 (3) MG/3ML IN SOLN
3.0000 mL | Freq: Four times a day (QID) | RESPIRATORY_TRACT | Status: DC
  Administered 2016-04-20: 3 mL via RESPIRATORY_TRACT
  Filled 2016-04-19 (×2): qty 3

## 2016-04-19 MED ORDER — SALINE SPRAY 0.65 % NA SOLN
1.0000 | NASAL | Status: DC | PRN
  Administered 2016-04-20 (×3): 1 via NASAL
  Filled 2016-04-19 (×2): qty 44

## 2016-04-19 MED ORDER — NICOTINE 21 MG/24HR TD PT24: 21.0000 mg | MEDICATED_PATCH | Freq: Every day | TRANSDERMAL | Status: DC

## 2016-04-19 MED ORDER — GUAIFENESIN ER 600 MG PO TB12
1200.0000 mg | ORAL_TABLET | Freq: Two times a day (BID) | ORAL | Status: DC
  Administered 2016-04-19 – 2016-04-20 (×2): 1200 mg via ORAL
  Filled 2016-04-19 (×3): qty 2

## 2016-04-19 MED ORDER — CETYLPYRIDINIUM CHLORIDE 0.05 % MT LIQD: 7.0000 mL | Freq: Two times a day (BID) | OROMUCOSAL | Status: DC

## 2016-04-19 MED ORDER — GUAIFENESIN ER 600 MG PO TB12: 1200.0000 mg | ORAL_TABLET | Freq: Two times a day (BID) | ORAL | Status: DC

## 2016-04-19 MED ORDER — SUCRALFATE 1 GM/10ML PO SUSP: 1.0000 g | Freq: Three times a day (TID) | ORAL | Status: DC

## 2016-04-19 MED ORDER — DOCUSATE SODIUM 100 MG PO CAPS
100.0000 mg | ORAL_CAPSULE | Freq: Two times a day (BID) | ORAL | Status: DC
  Administered 2016-04-19 – 2016-04-20 (×2): 100 mg via ORAL
  Filled 2016-04-19 (×2): qty 1

## 2016-04-19 MED ORDER — PREDNISONE 10 MG (21) PO TBPK: 10.0000 mg | ORAL_TABLET | Freq: Every day | ORAL | Status: DC

## 2016-04-19 NOTE — Plan of Care (Signed)
Problem: Activity: Goal: Risk for activity intolerance will decrease Outcome: Progressing Patient ambulated around the nursing station once.

## 2016-04-19 NOTE — Progress Notes (Signed)
[facility] NAME: Isabel Jimenez    MR#:  [id number]  DATE OF BIRTH:  Nov 07, 1961  SUBJECTIVE:  CHIEF COMPLAINT:   Chief Complaint  Patient presents with  . Shortness of Breath  The patient is 55 year old Caucasian female with history of COPD, chronic bronchitis, ongoing tobacco abuse, chronic respiratory failure, on oxygen therapy at 2 L of oxygen through nasal cannulas at night, who presents to the hospital with complaints of shortness of breath, wheezing, cough, chest tightness. On arrival to the hospital patient. Chest x-ray revealed no pneumonia, patient was initiated on steroids, inhalation therapy, antibiotics, she feels better today.She is weaned off oxygen and  oxygen saturation is 91% on room air at rest, pending on exertion  Review of Systems  Constitutional: Negative for fever, chills and weight loss.  HENT: Negative for congestion.   Eyes: Negative for blurred vision and double vision.  Respiratory: Positive for cough, sputum production and wheezing.   Cardiovascular: Negative for chest pain, palpitations, orthopnea, leg swelling and PND.  Gastrointestinal: Negative for nausea, vomiting, abdominal pain, diarrhea, constipation and blood in stool.  Genitourinary: Negative for dysuria, urgency, frequency and hematuria.  Musculoskeletal: Negative for falls.  Neurological: Negative for dizziness, tremors, focal weakness and headaches.  Endo/Heme/Allergies: Does not bruise/bleed easily.  Psychiatric/Behavioral: Negative for depression. The patient does not have insomnia.     VITAL SIGNS: Blood pressure 102/45, pulse 71, temperature 97.6 F (36.4 C), temperature source Oral, resp. rate 18, height 5\' 4"  (1.626 m), weight 90.266 kg (199 lb), SpO2 91 %.  PHYSICAL EXAMINATION:   GENERAL:  55 y.o.-year-old patient lying in the bed in no significant respiratory distress EYES: Pupils equal, round, reactive to light and  accommodation. No scleral icterus. Extraocular muscles intact.  HEENT: Head atraumatic, normocephalic. Oropharynx and nasopharynx clear.  NECK:  Supple, no jugular venous distention. No thyroid enlargement, no tenderness.  LUNGS: Better air entrance, breath sounds bilaterally, still few scattered bilateral inspiratory and expiratory wheezing, no rales,rhonchi or crepitation. Not using accessory muscles of respiration.  CARDIOVASCULAR: S1, S2 normal. No murmurs, rubs, or gallops.  ABDOMEN: Soft, nontender, nondistended. Bowel sounds present. No organomegaly or mass.  EXTREMITIES: No pedal edema, cyanosis, or clubbing.  NEUROLOGIC: Cranial nerves II through XII are intact. Muscle strength 5/5 in all extremities. Sensation intact. Gait not checked.  PSYCHIATRIC: The patient is alert and oriented x 3. Smiling and comfortable today SKIN: No obvious rash, lesion, or ulcer.   ORDERS/RESULTS REVIEWED:   CBC  Recent Labs Lab 04/17/16 1851 04/18/16 0529  WBC 8.2 6.6  HGB 14.1 13.4  HCT 42.3 39.9  PLT 269 261  MCV 90.4 90.2  MCH 30.1 30.3  MCHC 33.3 33.6  RDW 14.5 14.6*   ------------------------------------------------------------------------------------------------------------------  Chemistries   Recent Labs Lab 04/17/16 1851 04/18/16 0529  NA 141 141  K 3.9 4.1  CL 105 107  CO2 28 28  GLUCOSE 119* 167*  BUN 17 14  CREATININE 0.67 0.75  CALCIUM 8.8* 8.8*   ------------------------------------------------------------------------------------------------------------------ estimated creatinine clearance is 84.4 mL/min (by C-G formula based on Cr of 0.75). ------------------------------------------------------------------------------------------------------------------ No results for input(s): TSH, T4TOTAL, T3FREE, THYROIDAB in the last 72 hours.  Invalid input(s): FREET3  Cardiac Enzymes  Recent Labs Lab 04/17/16 1851  TROPONINI <0.03    ------------------------------------------------------------------------------------------------------------------ Invalid input(s): POCBNP ---------------------------------------------------------------------------------------------------------------  RADIOLOGY: Dg Chest 2 View  04/17/2016  CLINICAL DATA:  Shortness of breath beginning yesterday. Hypoxia. Chronic asthma and COPD. EXAM: CHEST  2 VIEW COMPARISON:  04/07/2016 and 01/11/2012 FINDINGS: The heart size and mediastinal contours are within normal limits. Both lungs are clear. The visualized skeletal structures are unremarkable. IMPRESSION: No active cardiopulmonary disease. Electronically Signed   By: Earle Gell M.D.   On: 04/17/2016 19:44    EKG:  Orders placed or performed during the hospital encounter of 04/17/16  . ED EKG  . ED EKG    ASSESSMENT AND PLAN:  Principal Problem:   COPD exacerbation ([place]) Active Problems:   Lupus ([place])   Hypothyroidism   GERD (gastroesophageal reflux disease) #1. Acute on chronic respiratory failure with hypoxia due to COPD exacerbation, weaning off oxygen as tolerated, check O2 sats on room air on exertion. Patient does have oxygen at home, however, used it only at night in the past. #2. COPD exacerbation due to acute on chronic bronchitis, questionable viral, improved with current therapy, appreciate pulmonologist input, improved clinically. MRSA PCR was negative. Discharge home on antibiotics. Taper steroids #3. Hyperglycemia due to steroids, initiate patient on sliding scale insulin if needed #4. Systemic lupus erythematosus, unspecified, continue outpatient medications, no changes #5. Gastroesophageal reflux disease, continue PPI, Carafate, follow clinically  Management plans discussed with the patient, family and they are in agreement.   DRUG ALLERGIES:  Allergies  Allergen Reactions  . Methotrexate Derivatives Anaphylaxis  . Cefdinir   . Chantix [Varenicline]     rash  .  Codeine     SOB, itching  . Plaquenil [Hydroxychloroquine] Other (See Comments)    Hair loss and rash    CODE STATUS:     Code Status Orders        Start     Ordered   04/17/16 2355  Full code   Continuous     04/17/16 2354    Code Status History    Date Active Date Inactive Code Status Order ID Comments User Context   This patient has a current code status but no historical code status.      TOTAL TIME TAKING CARE OF THIS PATIENT: 35 minutes.    Theodoro Grist M.D on 04/19/2016 at 12:39 PM  Between 7am to 6pm - Pager - [phone]  After 6pm go to [email or web address] - password [email or web address] [facility]  [place] [facility]  Office  [phone]  CC: Primary care physician; Tawni Millers, MD

## 2016-04-19 NOTE — Progress Notes (Signed)
SATURATION QUALIFICATIONS: (This note is used to comply with regulatory documentation for home oxygen)  Patient Saturations on Room Air at Rest = 93%  Patient Saturations on Room Air while Ambulating = 86%  Patient Saturations on 1 Liters of oxygen while Ambulating = 92%  Please briefly explain why patient needs home oxygen: patient ambulated around the nursing station. Oxygen sats dropped down to 86%.

## 2016-04-20 MED ORDER — SALINE SPRAY 0.65 % NA SOLN: 1.0000 | NASAL | Status: DC | PRN

## 2016-04-20 MED ORDER — AZITHROMYCIN 250 MG PO TABS
500.0000 mg | ORAL_TABLET | Freq: Every day | ORAL | Status: DC
  Administered 2016-04-20: 500 mg via ORAL
  Filled 2016-04-20: qty 2

## 2016-04-20 MED ORDER — IPRATROPIUM-ALBUTEROL 0.5-2.5 (3) MG/3ML IN SOLN
3.0000 mL | RESPIRATORY_TRACT | Status: DC | PRN
  Administered 2016-04-20: 06:00:00 3 mL via RESPIRATORY_TRACT

## 2016-04-20 MED ORDER — PREDNISONE 50 MG PO TABS
60.0000 mg | ORAL_TABLET | Freq: Every day | ORAL | Status: DC
  Administered 2016-04-20: 09:00:00 60 mg via ORAL
  Filled 2016-04-20: qty 1

## 2016-04-20 MED ORDER — ALBUTEROL SULFATE (2.5 MG/3ML) 0.083% IN NEBU
2.5000 mg | INHALATION_SOLUTION | RESPIRATORY_TRACT | Status: DC
  Administered 2016-04-20: 15:00:00 2.5 mg via RESPIRATORY_TRACT
  Filled 2016-04-20 (×2): qty 3

## 2016-04-20 NOTE — Progress Notes (Signed)
Discharge instructions given and went over with patient at beside. Prescriptions given. All questions answered. Patient discharged home with husband via wheelchair by nursing staff. Madlyn Frankel, RN

## 2016-04-20 NOTE — Care Management (Addendum)
Husband. Drives. [place]. SELF-PAY. Has home O2 through [facility] specialist [phone] - charity care. No portable tanks. She's has re-applied with [facility] and has not heard back from them. She is followed by [facility]. She states they are able to meet her medical needs. She will need continuous O2. Fax [phone]. Need Rx for continuous O2.  Order has been faxed to [facility] for O2 pending approval.

## 2016-04-20 NOTE — Progress Notes (Signed)
Patient to be discharged home when O2 is delivered. Patient updated. Madlyn Frankel, RN

## 2016-04-20 NOTE — Progress Notes (Signed)
SATURATION QUALIFICATIONS: (This note is used to comply with regulatory documentation for home oxygen)  Patient Saturations on Room Air at Rest = 90%  Patient Saturations on Room Air while Ambulating = 86%  Patient Saturations on 2 Liters of oxygen while Ambulating = 90%  Madlyn Frankel, RN

## 2016-04-20 NOTE — Discharge Summary (Signed)
[facility] [place] NAME: Isabel Jimenez    MR#:  [id number]  DATE OF BIRTH:  1961/03/22  DATE OF ADMISSION:  04/17/2016 ADMITTING PHYSICIAN: Lance Coon, MD  DATE OF DISCHARGE: No discharge date for patient encounter.  PRIMARY CARE PHYSICIAN: Tawni Millers, MD     ADMISSION DIAGNOSIS:  Shortness of breath [R06.02] Chronic obstructive pulmonary disease, unspecified COPD type ([place]) [J44.9]  DISCHARGE DIAGNOSIS:  Principal Problem:   COPD exacerbation ([place]) Active Problems:   Acute on chronic respiratory failure with hypoxia (HCC)   Acute bronchitis   Tobacco abuse counseling   Lupus ([place])   Hypothyroidism   GERD (gastroesophageal reflux disease)   SECONDARY DIAGNOSIS:   Past Medical History  Diagnosis Date  . Lupus ([place])   . COPD (chronic obstructive pulmonary disease) ([place])   . Thyroid disease   . Asthma   . CHF (congestive heart failure) ([place])   . Migraines   . Joint pain   . Cancer ([place])     skin    .pro HOSPITAL COURSE:  The patient is 55 year old Caucasian female with history of COPD, chronic bronchitis, ongoing tobacco abuse, chronic respiratory failure, on oxygen therapy at 2 L of oxygen through nasal cannulas at night, who presents to the hospital with complaints of shortness of breath, wheezing, cough, chest tightness. On arrival to the hospital patient. Chest x-ray revealed no pneumonia, patient was initiated on steroids, inhalation therapy, antibiotics, she improved, but  oxygen saturation is was low on room air on  Exertion, qualifying her for continuous oxygen at home. She was seen and followed by pulmonologist while in hospital and recommended follow up as outpatient.  Discussion by problem: #1. Acute on chronic respiratory failure with hypoxia due to COPD exacerbation, weaned  off oxygen as tolerated, bu still oxygen saturations remain low on exertion, qualifying her for continuous oxygen at home, 2  liters/min is ordered upon discharge. She was seen and followed by pulmonologist while in hospital and recommended follow up as outpatient.  #2. COPD exacerbation due to acute on chronic bronchitis,  improved with current therapy, appreciate pulmonologist input. MRSA PCR was negative. Discharge home on Zithromax , oral steroid taper. #3. Hyperglycemia due to steroids, tapering  oral steroids at home. #4. Systemic lupus erythematosus, unspecified, continue outpatient medications, no changes #5. Gastroesophageal reflux disease, continue PPI, Carafate,good control. #6. Tobacco abuse, d/w patient for about 4 minutes, nicotine replacement therapy is initiated, agreeable.   DISCHARGE CONDITIONS:   stable  CONSULTS OBTAINED:  Treatment Team:  Laverle Hobby, MD  DRUG ALLERGIES:   Allergies  Allergen Reactions  . Methotrexate Derivatives Anaphylaxis  . Cefdinir   . Chantix [Varenicline]     rash  . Codeine     SOB, itching  . Plaquenil [Hydroxychloroquine] Other (See Comments)    Hair loss and rash    DISCHARGE MEDICATIONS:   Current Discharge Medication List    START taking these medications   Details  antiseptic oral rinse (CPC / CETYLPYRIDINIUM CHLORIDE 0.05%) 0.05 % LIQD solution 7 mLs by Mouth Rinse route 2 times daily at 12 noon and 4 pm. Qty: 354 mL, Refills: 5    chlorhexidine (PERIDEX) 0.12 % solution 15 mLs by Mouth Rinse route 2 (two) times daily. Qty: 120 mL, Refills: 0    guaiFENesin (MUCINEX) 600 MG 12 hr tablet Take 2 tablets (1,200 mg total) by mouth 2 (two) times daily. Qty: 20 tablet, Refills: 4  nicotine (NICODERM CQ - DOSED IN MG/24 HOURS) 21 mg/24hr patch Place 1 patch (21 mg total) onto the skin daily. Qty: 28 patch, Refills: 0    predniSONE (STERAPRED UNI-PAK 21 TAB) 10 MG (21) TBPK tablet Take 1 tablet (10 mg total) by mouth daily. Please take 6 pills/60 mg in the morning on the day of 1, then taper by 1 pill/10 mg every 2 days until finished.  Thank you Qty: 42 tablet, Refills: 0    sodium chloride (OCEAN) 0.65 % SOLN nasal spray Place 1 spray into both nostrils as needed for congestion. Qty: 1 Bottle, Refills: 5    sucralfate (CARAFATE) 1 GM/10ML suspension Take 10 mLs (1 g total) by mouth 3 (three) times daily with meals. Qty: 420 mL, Refills: 0      CONTINUE these medications which have CHANGED   Details  azithromycin (ZITHROMAX) 250 MG tablet Take 1 tablet (250 mg total) by mouth daily. Qty: 6 each, Refills: 0      CONTINUE these medications which have NOT CHANGED   Details  albuterol (PROVENTIL HFA;VENTOLIN HFA) 108 (90 Base) MCG/ACT inhaler Inhale 1-2 puffs into the lungs every 4 (four) hours as needed for wheezing or shortness of breath.    albuterol (PROVENTIL) (2.5 MG/3ML) 0.083% nebulizer solution Take 2.5 mg by nebulization every 6 (six) hours as needed for wheezing or shortness of breath.    B Complex Vitamins (B COMPLEX PO) Take by mouth daily.    diphenhydrAMINE (BENADRYL) 25 mg capsule Take 2 capsules (50 mg total) by mouth every 6 (six) hours as needed. Qty: 60 capsule, Refills: 0    docusate sodium (COLACE) 100 MG capsule Take 200 mg by mouth daily.    fluticasone (FLONASE) 50 MCG/ACT nasal spray Place 1 spray into both nostrils daily.    Fluticasone-Salmeterol (ADVAIR) 500-50 MCG/DOSE AEPB Inhale 1 puff into the lungs 2 (two) times daily.    folic acid (FOLVITE) 1 MG tablet Take 1 mg by mouth daily.    levothyroxine (SYNTHROID, LEVOTHROID) 100 MCG tablet Take 100 mcg by mouth daily before breakfast.    mometasone (ELOCON) 0.1 % ointment Apply topically as needed.    omeprazole (PRILOSEC) 40 MG capsule Take 40 mg by mouth daily.    tiotropium (SPIRIVA) 18 MCG inhalation capsule Place 18 mcg into inhaler and inhale daily.    triamcinolone cream (KENALOG) 0.1 % Apply 1 application topically as needed.      STOP taking these medications     predniSONE (DELTASONE) 20 MG tablet           DISCHARGE INSTRUCTIONS:    Follow up with pulmonologist as outpatient  If you experience worsening of your admission symptoms, develop shortness of breath, life threatening emergency, suicidal or homicidal thoughts you must seek medical attention immediately by calling 911 or calling your MD immediately  if symptoms less severe.  You Must read complete instructions/literature along with all the possible adverse reactions/side effects for all the Medicines you take and that have been prescribed to you. Take any new Medicines after you have completely understood and accept all the possible adverse reactions/side effects.   Please note  You were cared for by a hospitalist during your hospital stay. If you have any questions about your discharge medications or the care you received while you were in the hospital after you are discharged, you can call the unit and asked to speak with the hospitalist on call if the hospitalist that took care of you is  not available. Once you are discharged, your primary care physician will handle any further medical issues. Please note that NO REFILLS for any discharge medications will be authorized once you are discharged, as it is imperative that you return to your primary care physician (or establish a relationship with a primary care physician if you do not have one) for your aftercare needs so that they can reassess your need for medications and monitor your lab values.    Today   CHIEF COMPLAINT:   Chief Complaint  Patient presents with  . Shortness of Breath    HISTORY OF PRESENT ILLNESS:  Isabel Jimenez  is a 55 y.o. female with a known history of COPD, chronic bronchitis, ongoing tobacco abuse, chronic respiratory failure, on oxygen therapy at 2 L of oxygen through nasal cannulas at night, who presents to the hospital with complaints of shortness of breath, wheezing, cough, chest tightness. On arrival to the hospital patient. Chest x-ray revealed  no pneumonia, patient was initiated on steroids, inhalation therapy, antibiotics, she improved, but  oxygen saturation is was low on room air on  Exertion, qualifying her for continuous oxygen at home. She was seen and followed by pulmonologist while in hospital and recommended follow up as outpatient.  Discussion by problem: #1. Acute on chronic respiratory failure with hypoxia due to COPD exacerbation, weaned  off oxygen as tolerated, bu still oxygen saturations remain low on exertion, qualifying her for continuous oxygen at home, 2 liters/min is ordered upon discharge. She was seen and followed by pulmonologist while in hospital and recommended follow up as outpatient.  #2. COPD exacerbation due to acute on chronic bronchitis,  improved with current therapy, appreciate pulmonologist input. MRSA PCR was negative. Discharge home on Zithromax , oral steroid taper. #3. Hyperglycemia due to steroids, tapering  oral steroids at home. #4. Systemic lupus erythematosus, unspecified, continue outpatient medications, no changes #5. Gastroesophageal reflux disease, continue PPI, Carafate,good control. #6. Tobacco abuse, d/w patient for about 4 minutes, nicotine replacement therapy is initiated, agreeable.    VITAL SIGNS:  Blood pressure 151/77, pulse 80, temperature 98.1 F (36.7 C), temperature source Oral, resp. rate 20, height 5\' 4"  (1.626 m), weight 90.266 kg (199 lb), SpO2 93 %.  I/O:   Intake/Output Summary (Last 24 hours) at 04/20/16 1750 Last data filed at 04/20/16 0900  Gross per 24 hour  Intake    480 ml  Output      0 ml  Net    480 ml    PHYSICAL EXAMINATION:  GENERAL:  55 y.o.-year-old patient lying in the bed with no acute distress.  EYES: Pupils equal, round, reactive to light and accommodation. No scleral icterus. Extraocular muscles intact.  HEENT: Head atraumatic, normocephalic. Oropharynx and nasopharynx clear.  NECK:  Supple, no jugular venous distention. No thyroid  enlargement, no tenderness.  LUNGS: Normal breath sounds bilaterally, no wheezing, rales,rhonchi or crepitation. No use of accessory muscles of respiration.  CARDIOVASCULAR: S1, S2 normal. No murmurs, rubs, or gallops.  ABDOMEN: Soft, non-tender, non-distended. Bowel sounds present. No organomegaly or mass.  EXTREMITIES: No pedal edema, cyanosis, or clubbing.  NEUROLOGIC: Cranial nerves II through XII are intact. Muscle strength 5/5 in all extremities. Sensation intact. Gait not checked.  PSYCHIATRIC: The patient is alert and oriented x 3.  SKIN: No obvious rash, lesion, or ulcer.   DATA REVIEW:   CBC  Recent Labs Lab 04/18/16 0529  WBC 6.6  HGB 13.4  HCT 39.9  PLT 261  Chemistries   Recent Labs Lab 04/18/16 0529  NA 141  K 4.1  CL 107  CO2 28  GLUCOSE 167*  BUN 14  CREATININE 0.75  CALCIUM 8.8*    Cardiac Enzymes  Recent Labs Lab 04/17/16 1851  TROPONINI <0.03    Microbiology Results  Results for orders placed or performed during the hospital encounter of 04/17/16  MRSA PCR Screening     Status: None   Collection Time: 04/18/16 12:01 AM  Result Value Ref Range Status   MRSA by PCR NEGATIVE NEGATIVE Final    Comment:        The GeneXpert MRSA Assay (FDA approved for NASAL specimens only), is one component of a comprehensive MRSA colonization surveillance program. It is not intended to diagnose MRSA infection nor to guide or monitor treatment for MRSA infections.     RADIOLOGY:  No results found.  EKG:   Orders placed or performed during the hospital encounter of 04/17/16  . ED EKG  . ED EKG      Management plans discussed with the patient, family and they are in agreement.  CODE STATUS:     Code Status Orders        Start     Ordered   04/17/16 2355  Full code   Continuous     04/17/16 2354    Code Status History    Date Active Date Inactive Code Status Order ID Comments User Context   This patient has a current code status  but no historical code status.      TOTAL TIME TAKING CARE OF THIS PATIENT: 40 minutes.    Theodoro Grist M.D on 04/20/2016 at 5:50 PM  Between 7am to 6pm - Pager - [phone]  After 6pm go to [email or web address] - password [email or web address] [facility]  [facility]  Office  [phone]  CC: Primary care physician; Tawni Millers, MD

## 2016-04-20 NOTE — Care Management (Addendum)
Discharge order is in but not Home O2 has been arranged. Orders sent this AM however when I just saw that patient had discharge order in I called [facility] Specialist [phone] and no one has escalated O2 need. They asked that email request to '[email or web address]' which I did. Patient cannot leave without portable tank which she does not have available at home. I have discussed with RN that discharge may need to be cancelled if home O2 and portable tank with [facility] cannot be arranged at this late notice. Patient has O2 concentrator in the home but no portable tanks. This RNCM discussed this with  MD this AM when Rx was requested. RN advised; patient advised that home O2 may not be available this late in the afternoon and discharge should be delayed. I will give [facility] until 430 PM to respond to my email and will update RN. Called [facility] back [place]) at [phone] and they are saying that patient is already on continuous O2 at home through them. [facility] states that she "did not get O2 tanks". I asked how she was on continuous and not have portable O2 tanks as she is not home bound?Marland Kitchen They did not have an answer to that. Juliann Pulse  then came back to state "she was only on nocturnal O2".  I explained that she will need portable O2 tank to be delivered to this hospital room in order to safely send her home. She then states that she never received the Rx for O2 (page 2 of fax) so I sent a copy again through the above [facility] email which she confirmed receipt. Per Juliann Pulse with [facility] O2 will be delivered to this room prior to discharge. RN notified. ETA: 4 hours.

## 2016-04-27 ENCOUNTER — Ambulatory Visit (INDEPENDENT_AMBULATORY_CARE_PROVIDER_SITE_OTHER): Payer: Self-pay | Admitting: Internal Medicine

## 2016-04-27 ENCOUNTER — Encounter: Payer: Self-pay | Admitting: Internal Medicine

## 2016-04-27 VITALS — BP 136/80 | HR 91 | Ht 64.0 in | Wt 205.0 lb

## 2016-04-27 DIAGNOSIS — J441 Chronic obstructive pulmonary disease with (acute) exacerbation: Secondary | ICD-10-CM

## 2016-04-27 NOTE — Progress Notes (Signed)
*- [facility]     Assessment and Plan:  COPD exacerbation. -This appears improved, the patient appears to be at her baseline of severe COPD/emphysema. -Continue Advair, Spiriva, albuterol nebulizer. -Visit, we will obtain pulmonary function testing, and consider referring for pulmonary rehabilitation, CT lung cancer screening.  Acute on chronic respiratory failure. -The patient's and ventilatory saturations were checked today, she no longer requires ambulatory oxygen. -We'll continue nocturnal oxygen at 2 L as before her hospitalization.  Nicotine abuse. -Discussed smoking cessation today, she is just had her last cigarette yesterday. -Encouraged to continue smoking cessation.   Date: 04/27/2016  MRN# [id number] SIBONEY BAIRD [birth year]   MARYON EARNHEART is a 55 y.o. old female seen in follow up for chief complaint of  Chief Complaint  Patient presents with  . Hospitalization Follow-up     HPI:   The patient is a 55 year old female smoker with lupus on chronic oxygen therapy at 2 L nasal cannula. She was recently discharged from the hospital on 04/20/16 for acute exacerbation of COPD. She was discharged with guaifenesin, nicotine patch, prednisone taper, azithromycin. She was asked to continue taking Spiriva and Advair.  Before the hospitalization she was on oxygen at 2L at night. Currently she is on 2L at all times.  She feels that her breathing is much improved, she has cut down her cigs to 2/day, and today she is at zero.   She is still on a steroid taper, she has 3 days left. She is on advair 500 bid, spiriva daily. She uses ventolin, she has used it 3 times in the past week. She uses albuterol nebs 2 to 3 times per day and feels that it helps. They have a dog at their trailer  Sat at RA at rest is 96% and HR 67. After walking 600 feet her sat was 92% and HR 93.  She walked at moderate pace, she had mild dyspnea.    Medication:   Outpatient  Encounter Prescriptions as of 04/27/2016  Medication Sig  . albuterol (PROVENTIL HFA;VENTOLIN HFA) 108 (90 Base) MCG/ACT inhaler Inhale 1-2 puffs into the lungs every 4 (four) hours as needed for wheezing or shortness of breath.  Marland Kitchen albuterol (PROVENTIL) (2.5 MG/3ML) 0.083% nebulizer solution Take 2.5 mg by nebulization every 6 (six) hours as needed for wheezing or shortness of breath.  Marland Kitchen antiseptic oral rinse (CPC / CETYLPYRIDINIUM CHLORIDE 0.05%) 0.05 % LIQD solution 7 mLs by Mouth Rinse route 2 times daily at 12 noon and 4 pm.  . azithromycin (ZITHROMAX) 250 MG tablet Take 1 tablet (250 mg total) by mouth daily.  . B Complex Vitamins (B COMPLEX PO) Take by mouth daily.  . chlorhexidine (PERIDEX) 0.12 % solution 15 mLs by Mouth Rinse route 2 (two) times daily.  . diphenhydrAMINE (BENADRYL) 25 mg capsule Take 2 capsules (50 mg total) by mouth every 6 (six) hours as needed.  . docusate sodium (COLACE) 100 MG capsule Take 200 mg by mouth daily.  . fluticasone (FLONASE) 50 MCG/ACT nasal spray Place 1 spray into both nostrils daily.  . Fluticasone-Salmeterol (ADVAIR) 500-50 MCG/DOSE AEPB Inhale 1 puff into the lungs 2 (two) times daily.  . folic acid (FOLVITE) 1 MG tablet Take 1 mg by mouth daily.  Marland Kitchen guaiFENesin (MUCINEX) 600 MG 12 hr tablet Take 2 tablets (1,200 mg total) by mouth 2 (two) times daily.  Marland Kitchen levothyroxine (SYNTHROID, LEVOTHROID) 100 MCG tablet Take 100 mcg by mouth daily before breakfast.  . mometasone (ELOCON)  0.1 % ointment Apply topically as needed.  . nicotine (NICODERM CQ - DOSED IN MG/24 HOURS) 21 mg/24hr patch Place 1 patch (21 mg total) onto the skin daily.  Marland Kitchen omeprazole (PRILOSEC) 40 MG capsule Take 40 mg by mouth daily.  . predniSONE (STERAPRED UNI-PAK 21 TAB) 10 MG (21) TBPK tablet Take 1 tablet (10 mg total) by mouth daily. Please take 6 pills/60 mg in the morning on the day of 1, then taper by 1 pill/10 mg every 2 days until finished. Thank you  . sodium chloride (OCEAN) 0.65  % SOLN nasal spray Place 1 spray into both nostrils as needed for congestion.  . sucralfate (CARAFATE) 1 GM/10ML suspension Take 10 mLs (1 g total) by mouth 3 (three) times daily with meals.  . tiotropium (SPIRIVA) 18 MCG inhalation capsule Place 18 mcg into inhaler and inhale daily.  Marland Kitchen triamcinolone cream (KENALOG) 0.1 % Apply 1 application topically as needed.   No facility-administered encounter medications on file as of 04/27/2016.     Allergies:  Methotrexate derivatives; Cefdinir; Chantix; Codeine; and Plaquenil  Review of Systems: Gen:  Denies  fever, sweats. HEENT: Denies blurred vision. Cvc:  No dizziness, chest pain or heaviness Resp:   Denies cough or sputum porduction. Gi: Denies swallowing difficulty, stomach pain. constipation Gu:  Denies bladder incontinence, burning urine Ext:   No Joint pain, stiffness. Skin: No skin rash, easy bruising. Endoc:  No polyuria, polydipsia. Psych: No depression, insomnia. Other:  All other systems were reviewed and found to be negative other than what is mentioned in the HPI.   Physical Examination:   VS: BP 136/80 mmHg  Pulse 91  Ht 5\' 4"  (1.626 m)  Wt 205 lb (92.987 kg)  BMI 35.17 kg/m2  SpO2 96%  General Appearance: No distress  Neuro:without focal findings,  speech normal,  HEENT: PERRLA, EOM intact. Pulmonary: normal breath sounds, No wheezing.   CardiovascularNormal S1,S2.  No m/r/g.   Abdomen: Benign, Soft, non-tender. Renal:  No costovertebral tenderness  GU:  Not performed at this time. Endoc: No evident thyromegaly, no signs of acromegaly. Skin:   warm, no rash. Extremities: normal, no cyanosis, clubbing.   LABORATORY PANEL:   CBC No results for input(s): WBC, HGB, HCT, PLT in the last 168 hours. ------------------------------------------------------------------------------------------------------------------  Chemistries  No results for input(s): NA, K, CL, CO2, GLUCOSE, BUN, CREATININE, CALCIUM, MG, AST,  ALT, ALKPHOS, BILITOT in the last 168 hours.  Invalid input(s): GFRCGP ------------------------------------------------------------------------------------------------------------------  Cardiac Enzymes No results for input(s): TROPONINI in the last 168 hours. ------------------------------------------------------------  RADIOLOGY:   No results found for this or any previous visit. Results for orders placed during the hospital encounter of 04/17/16  DG Chest 2 View   Narrative CLINICAL DATA:  Shortness of breath beginning yesterday. Hypoxia. Chronic asthma and COPD.  EXAM: CHEST  2 VIEW  COMPARISON:  04/07/2016 and 01/11/2012  FINDINGS: The heart size and mediastinal contours are within normal limits. Both lungs are clear. The visualized skeletal structures are unremarkable.  IMPRESSION: No active cardiopulmonary disease.   Electronically Signed   By: Earle Gell M.D.   On: 04/17/2016 19:44    ------------------------------------------------------------------------------------------------------------------  Thank  you for allowing [facility] to assist in the care of your patient. Our recommendations are noted above.  Please contact us if we can be of further service.   Marda Stalker, MD.  [facility] Office Number: [phone]  Patricia Pesa, M.D.  Vilinda Boehringer, M.D.  Merton Border, M.D  04/27/2016

## 2016-04-27 NOTE — Patient Instructions (Addendum)
Continue advair, spiriva, nebs.   Pulmonary function test before next visit in 4 months.  Quitting smoking is the best thing you can do for your health.   Can discontinue daytime oxygen, continue night time oxygen at 2L.

## 2016-04-27 NOTE — Addendum Note (Signed)
Addended by: Maryanna Shape A on: 04/27/2016 12:10 PM   Modules accepted: Orders

## 2016-06-10 ENCOUNTER — Other Ambulatory Visit: Payer: Self-pay

## 2016-06-10 DIAGNOSIS — E039 Hypothyroidism, unspecified: Secondary | ICD-10-CM

## 2016-06-10 DIAGNOSIS — J45909 Unspecified asthma, uncomplicated: Secondary | ICD-10-CM

## 2016-06-11 LAB — CBC WITH DIFFERENTIAL/PLATELET
BASOS ABS: 0 10*3/uL (ref 0.0–0.2)
BASOS: 0 %
EOS (ABSOLUTE): 0.2 10*3/uL (ref 0.0–0.4)
Eos: 4 %
HEMOGLOBIN: 13.4 g/dL (ref 11.1–15.9)
Hematocrit: 40 % (ref 34.0–46.6)
IMMATURE GRANS (ABS): 0 10*3/uL (ref 0.0–0.1)
Immature Granulocytes: 0 %
LYMPHS: 28 %
Lymphocytes Absolute: 1.4 10*3/uL (ref 0.7–3.1)
MCH: 29.7 pg (ref 26.6–33.0)
MCHC: 33.5 g/dL (ref 31.5–35.7)
MCV: 89 fL (ref 79–97)
MONOCYTES: 9 %
Monocytes Absolute: 0.4 10*3/uL (ref 0.1–0.9)
NEUTROS ABS: 2.9 10*3/uL (ref 1.4–7.0)
NEUTROS PCT: 59 %
PLATELETS: 316 10*3/uL (ref 150–379)
RBC: 4.51 x10E6/uL (ref 3.77–5.28)
RDW: 14.3 % (ref 12.3–15.4)
WBC: 5 10*3/uL (ref 3.4–10.8)

## 2016-06-11 LAB — COMPREHENSIVE METABOLIC PANEL
ALK PHOS: 100 IU/L (ref 39–117)
ALT: 42 IU/L — AB (ref 0–32)
AST: 29 IU/L (ref 0–40)
Albumin/Globulin Ratio: 1.6 (ref 1.2–2.2)
Albumin: 4.2 g/dL (ref 3.5–5.5)
BILIRUBIN TOTAL: 0.4 mg/dL (ref 0.0–1.2)
BUN/Creatinine Ratio: 17 (ref 9–23)
BUN: 15 mg/dL (ref 6–24)
CHLORIDE: 100 mmol/L (ref 96–106)
CO2: 25 mmol/L (ref 18–29)
Calcium: 8.9 mg/dL (ref 8.7–10.2)
Creatinine, Ser: 0.9 mg/dL (ref 0.57–1.00)
GFR calc non Af Amer: 71 mL/min/{1.73_m2} (ref >59)
GFR, EST AFRICAN AMERICAN: 82 mL/min/{1.73_m2} (ref >59)
GLUCOSE: 87 mg/dL (ref 65–99)
Globulin, Total: 2.6 g/dL (ref 1.5–4.5)
POTASSIUM: 4.3 mmol/L (ref 3.5–5.2)
Sodium: 142 mmol/L (ref 134–144)
TOTAL PROTEIN: 6.8 g/dL (ref 6.0–8.5)

## 2016-06-11 LAB — URINALYSIS
Bilirubin, UA: NEGATIVE
Glucose, UA: NEGATIVE
KETONES UA: NEGATIVE
Leukocytes, UA: NEGATIVE
NITRITE UA: NEGATIVE
PH UA: 5.5 (ref 5.0–7.5)
Protein, UA: NEGATIVE
RBC UA: NEGATIVE
SPEC GRAV UA: 1.014 (ref 1.005–1.030)
UUROB: 0.2 mg/dL (ref 0.2–1.0)

## 2016-06-11 LAB — LIPID PANEL
CHOL/HDL RATIO: 3.4 ratio (ref 0.0–4.4)
Cholesterol, Total: 231 mg/dL — ABNORMAL HIGH (ref 100–199)
HDL: 67 mg/dL (ref >39)
LDL Calculated: 138 mg/dL — ABNORMAL HIGH (ref 0–99)
TRIGLYCERIDES: 129 mg/dL (ref 0–149)
VLDL Cholesterol Cal: 26 mg/dL (ref 5–40)

## 2016-06-11 LAB — TSH: TSH: 4.17 u[IU]/mL (ref 0.450–4.500)

## 2016-06-17 ENCOUNTER — Ambulatory Visit: Payer: Self-pay | Admitting: Internal Medicine

## 2016-07-06 ENCOUNTER — Other Ambulatory Visit: Payer: Self-pay | Admitting: Urology

## 2016-07-08 ENCOUNTER — Ambulatory Visit: Payer: Self-pay | Admitting: Internal Medicine

## 2016-07-08 VITALS — BP 135/79 | HR 65 | Temp 98.1°F | Wt 216.0 lb

## 2016-07-08 DIAGNOSIS — M329 Systemic lupus erythematosus, unspecified: Secondary | ICD-10-CM

## 2016-07-08 DIAGNOSIS — J42 Unspecified chronic bronchitis: Secondary | ICD-10-CM

## 2016-07-08 MED ORDER — MOMETASONE FUROATE 0.1 % EX OINT: TOPICAL_OINTMENT | CUTANEOUS | 2 refills | Status: DC | PRN

## 2016-07-08 MED ORDER — TRIAMCINOLONE ACETONIDE 0.1 % EX CREA: 1.0000 "application " | TOPICAL_CREAM | CUTANEOUS | 2 refills | Status: DC | PRN

## 2016-07-08 MED ORDER — FLUTICASONE PROPIONATE 50 MCG/ACT NA SUSP: 1.0000 | Freq: Every day | NASAL | 6 refills | Status: DC

## 2016-07-08 NOTE — Progress Notes (Signed)
Subjective:    Patient ID: Isabel Jimenez, female    DOB: April 06, 1961, 55 y.o.   MRN: [id number]  HPI   Pt. Presents for lab results review Pt. Is currently quitting smoking and is no longer using patch and has not smoked for 72 days Pt. Previously hospitalized for lupus and allergic reaction to medication  Pt. Presents with severe joint pain and cramps in feet Slight elevation in 1 of liver enzymes - not clinically significant  Patient Active Problem List   Diagnosis Date Noted  . Acute on chronic respiratory failure with hypoxia ([place]) 04/19/2016  . Acute bronchitis 04/19/2016  . Tobacco abuse counseling 04/19/2016  . COPD exacerbation ([place]) 04/17/2016  . Lupus ([place]) 01/01/2016  . Hypothyroidism 01/01/2016  . GERD (gastroesophageal reflux disease) 01/01/2016  . COPD (chronic obstructive pulmonary disease) ([place]) 01/01/2016  . Asthma 01/01/2016     Medication List       Accurate as of 07/08/16 11:38 AM. Always use your most recent med list.          albuterol 108 (90 Base) MCG/ACT inhaler Commonly known as:  PROVENTIL HFA;VENTOLIN HFA Inhale 1-2 puffs into the lungs every 4 (four) hours as needed for wheezing or shortness of breath.   albuterol (2.5 MG/3ML) 0.083% nebulizer solution Commonly known as:  PROVENTIL Take 2.5 mg by nebulization every 6 (six) hours as needed for wheezing or shortness of breath.   antiseptic oral rinse 0.05 % Liqd solution Commonly known as:  CPC / CETYLPYRIDINIUM CHLORIDE 0.05% 7 mLs by Mouth Rinse route 2 times daily at 12 noon and 4 pm.   azithromycin 250 MG tablet Commonly known as:  ZITHROMAX Take 1 tablet (250 mg total) by mouth daily.   chlorhexidine 0.12 % solution Commonly known as:  PERIDEX 15 mLs by Mouth Rinse route 2 (two) times daily.   diphenhydrAMINE 25 mg capsule Commonly known as:  BENADRYL Take 2 capsules (50 mg total) by mouth every 6 (six) hours as needed.   docusate sodium 100 MG capsule Commonly known as:   COLACE Take 200 mg by mouth daily.   fluticasone 50 MCG/ACT nasal spray Commonly known as:  FLONASE Place 1 spray into both nostrils daily.   Fluticasone-Salmeterol 500-50 MCG/DOSE Aepb Commonly known as:  ADVAIR Inhale 1 puff into the lungs 2 (two) times daily.   folic acid 1 MG tablet Commonly known as:  FOLVITE Take 1 mg by mouth daily. Reported on 04/27/2016   guaiFENesin 600 MG 12 hr tablet Commonly known as:  MUCINEX Take 2 tablets (1,200 mg total) by mouth 2 (two) times daily.   levothyroxine 100 MCG tablet Commonly known as:  SYNTHROID, LEVOTHROID Take 100 mcg by mouth daily before breakfast.   mometasone 0.1 % ointment Commonly known as:  ELOCON Apply topically as needed.   nicotine 21 mg/24hr patch Commonly known as:  NICODERM CQ - dosed in mg/24 hours Place 1 patch (21 mg total) onto the skin daily.   omeprazole 40 MG capsule Commonly known as:  PRILOSEC Take 40 mg by mouth daily.   predniSONE 10 MG (21) Tbpk tablet Commonly known as:  STERAPRED UNI-PAK 21 TAB Take 1 tablet (10 mg total) by mouth daily. Please take 6 pills/60 mg in the morning on the day of 1, then taper by 1 pill/10 mg every 2 days until finished. Thank you   sodium chloride 0.65 % Soln nasal spray Commonly known as:  OCEAN Place 1 spray into both nostrils as needed  for congestion.   sucralfate 1 GM/10ML suspension Commonly known as:  CARAFATE Take 10 mLs (1 g total) by mouth 3 (three) times daily with meals.   tiotropium 18 MCG inhalation capsule Commonly known as:  SPIRIVA Place 18 mcg into inhaler and inhale daily.   triamcinolone cream 0.1 % Commonly known as:  KENALOG Apply 1 application topically as needed.        Review of Systems BP 112/80      Objective:   Physical Exam  Constitutional: She is oriented to person, place, and time.  Cardiovascular: Normal rate and regular rhythm.   Pulmonary/Chest: Effort normal and breath sounds normal.  Neurological: She is alert  and oriented to person, place, and time.          Assessment & Plan:  Pt. Encouraged to loose weight and exercise  Follow up in 3 months w/ labs: Met C, CBC, GGT

## 2016-07-08 NOTE — Patient Instructions (Signed)
Follow up in 3 months w/ labs: Met C, CBC, GGT

## 2016-08-18 ENCOUNTER — Telehealth: Payer: Self-pay | Admitting: Internal Medicine

## 2016-08-18 NOTE — Telephone Encounter (Signed)
Spoke with pt and she states she is only using night time O2 and wants the portable tanks picked up. There was an order placed 04/27/16 for [facility] to pick them up but pt states she received one call in regards to that and they stated they would probably pick everything up and she questioned why and they stated they would call her back and she has not heard anything further. Informed pt we would contact them and if she hasn't heard from them in 1 week to give [place] a call back.   Suanne Marker, will you please contact company in regards to this matter.

## 2016-08-18 NOTE — Telephone Encounter (Signed)
Pt calling asking if we can send an order to pick up the oxygen tanks For she is getting charged for them but does not need them Please advise.  She can't afford to keep getting charged for this Please do as soon as possible.

## 2016-08-18 NOTE — Telephone Encounter (Signed)
Called and spoke with Matilde Haymaker at [facility] [phone] who stated that she had no record of receiving D/C order for daytime o2.  Advised Tiffany that this order was faxed on 04/27/16 and confirmation was received.  Tiffany asked me to fax the D/C order for the daytime o2 again to her fax that sits by her desk [phone].  Re-faxed D/C of daytime o2 with instructions that patient still needs o2 at 2 lpm at night, only needs portable tanks picked up.   Asked Tiffany that once order was received if she could contact the patient to arrange to p/u daytime equipment and Tiffany stated that she would contact patient once order has been received.  Confirmation received via fax that order was successfully sent to [facility].  Contacted patient and advised of the above.  Advised patient that if she hasn't heard from [facility] to arrange p/u of this equipment (only the daytime o2 tanks) to contact me back at [phone].  Pt voiced understanding and advised me that she would.  Pt thanked me for my effort and advised me that if she didn't hear from them, she would let me know. Nothing else needed at this time. Rhonda J Cobb

## 2016-08-19 NOTE — Telephone Encounter (Signed)
[facility] picked up patient daytime 02 tanks today.  Form completed and faxed to [facility] for 02 at 2 lpm QHS to [phone].    Called and spoke with patient and pt stated that the tanks were picked up today around 2:00 pm.  Nothing else needed at this time. Rhonda J Cobb

## 2016-08-27 ENCOUNTER — Ambulatory Visit: Payer: Self-pay | Admitting: Internal Medicine

## 2016-09-03 ENCOUNTER — Other Ambulatory Visit: Payer: Self-pay | Admitting: Internal Medicine

## 2016-09-03 DIAGNOSIS — E039 Hypothyroidism, unspecified: Secondary | ICD-10-CM

## 2016-10-07 ENCOUNTER — Other Ambulatory Visit: Payer: Self-pay

## 2016-10-07 DIAGNOSIS — M329 Systemic lupus erythematosus, unspecified: Secondary | ICD-10-CM

## 2016-10-08 LAB — COMPREHENSIVE METABOLIC PANEL
ALK PHOS: 106 IU/L (ref 39–117)
ALT: 30 IU/L (ref 0–32)
AST: 19 IU/L (ref 0–40)
Albumin/Globulin Ratio: 1.4 (ref 1.2–2.2)
Albumin: 4 g/dL (ref 3.5–5.5)
BUN/Creatinine Ratio: 18 (ref 9–23)
BUN: 16 mg/dL (ref 6–24)
Bilirubin Total: 0.2 mg/dL (ref 0.0–1.2)
CALCIUM: 9.1 mg/dL (ref 8.7–10.2)
CO2: 28 mmol/L (ref 18–29)
CREATININE: 0.87 mg/dL (ref 0.57–1.00)
Chloride: 102 mmol/L (ref 96–106)
GFR calc Af Amer: 86 mL/min/{1.73_m2} (ref >59)
GFR, EST NON AFRICAN AMERICAN: 74 mL/min/{1.73_m2} (ref >59)
GLOBULIN, TOTAL: 2.9 g/dL (ref 1.5–4.5)
GLUCOSE: 99 mg/dL (ref 65–99)
Potassium: 5.1 mmol/L (ref 3.5–5.2)
SODIUM: 145 mmol/L — AB (ref 134–144)
Total Protein: 6.9 g/dL (ref 6.0–8.5)

## 2016-10-08 LAB — CBC WITH DIFFERENTIAL
BASOS ABS: 0 10*3/uL (ref 0.0–0.2)
Basos: 1 %
EOS (ABSOLUTE): 0.2 10*3/uL (ref 0.0–0.4)
EOS: 4 %
HEMATOCRIT: 41.8 % (ref 34.0–46.6)
HEMOGLOBIN: 13.5 g/dL (ref 11.1–15.9)
IMMATURE GRANULOCYTES: 0 %
Immature Grans (Abs): 0 10*3/uL (ref 0.0–0.1)
Lymphocytes Absolute: 1.8 10*3/uL (ref 0.7–3.1)
Lymphs: 36 %
MCH: 28.5 pg (ref 26.6–33.0)
MCHC: 32.3 g/dL (ref 31.5–35.7)
MCV: 88 fL (ref 79–97)
MONOCYTES: 13 %
MONOS ABS: 0.7 10*3/uL (ref 0.1–0.9)
Neutrophils Absolute: 2.4 10*3/uL (ref 1.4–7.0)
Neutrophils: 46 %
RBC: 4.73 x10E6/uL (ref 3.77–5.28)
RDW: 14 % (ref 12.3–15.4)
WBC: 5.1 10*3/uL (ref 3.4–10.8)

## 2016-10-08 LAB — GAMMA GT: GGT: 23 IU/L (ref 0–60)

## 2016-10-14 ENCOUNTER — Ambulatory Visit: Payer: Self-pay | Admitting: Ophthalmology

## 2016-10-14 ENCOUNTER — Ambulatory Visit: Payer: Self-pay | Admitting: Internal Medicine

## 2016-10-16 ENCOUNTER — Telehealth: Payer: Self-pay

## 2016-10-16 NOTE — Telephone Encounter (Signed)
PT called within the 24 hour time limit to cancel appt due to pt being sick. We rescheduled the appt.

## 2016-10-21 ENCOUNTER — Ambulatory Visit: Payer: Self-pay | Admitting: Ophthalmology

## 2016-10-28 ENCOUNTER — Encounter: Payer: Self-pay | Admitting: Internal Medicine

## 2016-10-28 ENCOUNTER — Ambulatory Visit: Payer: Self-pay | Admitting: Internal Medicine

## 2016-10-28 VITALS — BP 140/81 | HR 68 | Temp 98.1°F

## 2016-10-28 DIAGNOSIS — E039 Hypothyroidism, unspecified: Secondary | ICD-10-CM

## 2016-10-28 MED ORDER — GUAIFENESIN ER 600 MG PO TB12: 1200.0000 mg | ORAL_TABLET | Freq: Two times a day (BID) | ORAL | 1 refills | Status: DC

## 2016-10-28 NOTE — Progress Notes (Signed)
Subjective:    Patient ID: Isabel Jimenez, female    DOB: Jun 10, 1961, 55 y.o.   MRN: [id number]  HPI   Pt presents with chills and chest cold. Symptoms have been occurring for past 2 weeks. Reports mucus has turned to a green color.  F/u for lab results.  Pt reports she has gained weight since start of taking steroids.  BP is near target at 140/81. Will continue to monitor it. Pt quit smoking almost 6 months ago.   Patient Active Problem List   Diagnosis Date Noted  . Acute on chronic respiratory failure with hypoxia ([place]) 04/19/2016  . Acute bronchitis 04/19/2016  . Tobacco abuse counseling 04/19/2016  . COPD exacerbation ([place]) 04/17/2016  . Lupus 01/01/2016  . Hypothyroidism 01/01/2016  . GERD (gastroesophageal reflux disease) 01/01/2016  . COPD (chronic obstructive pulmonary disease) ([place]) 01/01/2016  . Asthma 01/01/2016   Current Outpatient Prescriptions on File Prior to Visit  Medication Sig Dispense Refill  . ADVAIR DISKUS 500-50 MCG/DOSE AEPB INHALE 1 PUFF EVERY 12 HOURS. 180 each 0  . albuterol (PROVENTIL HFA;VENTOLIN HFA) 108 (90 Base) MCG/ACT inhaler Inhale 1-2 puffs into the lungs every 4 (four) hours as needed for wheezing or shortness of breath.    Marland Kitchen albuterol (PROVENTIL) (2.5 MG/3ML) 0.083% nebulizer solution Take 2.5 mg by nebulization every 6 (six) hours as needed for wheezing or shortness of breath.    . docusate sodium (COLACE) 100 MG capsule Take 200 mg by mouth daily.    . fluticasone (FLONASE) 50 MCG/ACT nasal spray Place 1 spray into both nostrils daily. 16 g 6  . guaiFENesin (MUCINEX) 600 MG 12 hr tablet Take 2 tablets (1,200 mg total) by mouth 2 (two) times daily. 20 tablet 4  . levothyroxine (SYNTHROID, LEVOTHROID) 100 MCG tablet TAKE ONE TABLET BY MOUTH EVERY DAY FOR THYROID 90 tablet 1  . mometasone (ELOCON) 0.1 % ointment Apply topically as needed. 45 g 2  . omeprazole (PRILOSEC) 40 MG capsule Take 40 mg by mouth daily.    . sodium chloride (OCEAN)  0.65 % SOLN nasal spray Place 1 spray into both nostrils as needed for congestion. 1 Bottle 5  . tiotropium (SPIRIVA) 18 MCG inhalation capsule Place 18 mcg into inhaler and inhale daily.    Marland Kitchen triamcinolone cream (KENALOG) 0.1 % Apply 1 application topically as needed. 30 g 2  . antiseptic oral rinse (CPC / CETYLPYRIDINIUM CHLORIDE 0.05%) 0.05 % LIQD solution 7 mLs by Mouth Rinse route 2 times daily at 12 noon and 4 pm. (Patient not taking: Reported on 10/28/2016) 354 mL 5  . azithromycin (ZITHROMAX) 250 MG tablet Take 1 tablet (250 mg total) by mouth daily. (Patient not taking: Reported on 10/28/2016) 6 each 0  . chlorhexidine (PERIDEX) 0.12 % solution 15 mLs by Mouth Rinse route 2 (two) times daily. (Patient not taking: Reported on 10/28/2016) 120 mL 0  . diphenhydrAMINE (BENADRYL) 25 mg capsule Take 2 capsules (50 mg total) by mouth every 6 (six) hours as needed. (Patient not taking: Reported on 10/28/2016) 60 capsule 0  . folic acid (FOLVITE) 1 MG tablet Take 1 mg by mouth daily. Reported on 04/27/2016    . nicotine (NICODERM CQ - DOSED IN MG/24 HOURS) 21 mg/24hr patch Place 1 patch (21 mg total) onto the skin daily. (Patient not taking: Reported on 10/28/2016) 28 patch 0  . predniSONE (STERAPRED UNI-PAK 21 TAB) 10 MG (21) TBPK tablet Take 1 tablet (10 mg total) by mouth daily. Please take  6 pills/60 mg in the morning on the day of 1, then taper by 1 pill/10 mg every 2 days until finished. Thank you (Patient not taking: Reported on 10/28/2016) 42 tablet 0  . sucralfate (CARAFATE) 1 GM/10ML suspension Take 10 mLs (1 g total) by mouth 3 (three) times daily with meals. (Patient not taking: Reported on 10/28/2016) 420 mL 0   No current facility-administered medications on file prior to visit.      Review of Systems     Objective:   Physical Exam  Constitutional: She is oriented to person, place, and time.  Cardiovascular: Normal rate, regular rhythm and normal heart sounds.   Pulmonary/Chest:  Effort normal and breath sounds normal. She has no wheezes.  Neurological: She is alert and oriented to person, place, and time.    BP 140/81   Pulse 68   Temp 98.1 F (36.7 C) (Oral)   Continue to monitor Bp.      Assessment & Plan:   Labs today: TSH F/u in 6 months w/ labs: CBC, Met C, TSH, UA  Refill of Mucinex, 90 days BID, 1 refill

## 2016-10-28 NOTE — Patient Instructions (Signed)
Labs today  F/u in 6 months w/ labs

## 2016-10-29 LAB — TSH: TSH: 7.74 u[IU]/mL — AB (ref 0.450–4.500)

## 2016-11-05 ENCOUNTER — Telehealth: Payer: Self-pay | Admitting: Pharmacist

## 2016-11-05 NOTE — Telephone Encounter (Signed)
Mrs. Komm called to tell me that her TSH was elevated at 7. She asked me to send a communication to [facility] as her next follow up is in 6 months.  She is having issues with her hair falling out, aching joints and cramps in her feet. She is not sure if this is thyroid or Lupus related.  TSH = 7.74 10-28-16  Could you please follow up with Mrs. Beus?  Thank you, Seaborn Nakama K. Dicky Doe, PharmD [facility] [place] Operations Coordinator [phone]

## 2016-11-06 NOTE — Telephone Encounter (Signed)
Called pt to discuss her concern about her thyroid labs being elevated. Will discuss this with Dr. Mable Fill on wed and call back pt with a plan.

## 2016-11-13 ENCOUNTER — Telehealth: Payer: Self-pay

## 2016-11-13 NOTE — Telephone Encounter (Signed)
Called to give pt results from doctor. Phone is busy.

## 2016-11-17 ENCOUNTER — Telehealth: Payer: Self-pay

## 2016-11-17 ENCOUNTER — Encounter: Payer: Self-pay | Admitting: Internal Medicine

## 2016-11-17 NOTE — Telephone Encounter (Signed)
Rec'd PAP application from [facility] for Spiriva

## 2016-11-17 NOTE — Telephone Encounter (Signed)
Called to give results from dr. Mable Fill. No answer. Left msg.

## 2016-11-17 NOTE — Telephone Encounter (Signed)
PT called and gave her results. PT is very concerned about her hair falling out and her mood swings. She thinks its her thyroid. I explained that chaplin though the dosage was fine and that we would recheck in 6 wks. Appt made and pt verbalized understanding.

## 2016-11-18 ENCOUNTER — Telehealth: Payer: Self-pay

## 2016-11-18 NOTE — Telephone Encounter (Signed)
Returned PAP to [facility].  Verified signatures. 

## 2016-11-18 NOTE — Telephone Encounter (Signed)
Pt called back and we discussed what dr Mable Fill said and she verbalized understanding.

## 2016-11-18 NOTE — Telephone Encounter (Signed)
Pt sent in an email stating she did not understand how her TSH levels were not to high for her medication to be adjusted. I spoke with dr Mable Fill and he stated that her levels are not high enough or out of the window range to change her medication which is why we want to check her levels in 6 weeks. Left message for pt to call back.

## 2016-12-04 ENCOUNTER — Telehealth: Payer: Self-pay | Admitting: Pharmacist

## 2016-12-04 NOTE — Telephone Encounter (Signed)
Spiriva PAP submitted to manufacturer today.

## 2016-12-30 ENCOUNTER — Other Ambulatory Visit: Payer: Self-pay

## 2017-01-05 ENCOUNTER — Other Ambulatory Visit: Payer: Self-pay

## 2017-01-05 DIAGNOSIS — E039 Hypothyroidism, unspecified: Secondary | ICD-10-CM

## 2017-01-06 LAB — URINALYSIS
Bilirubin, UA: NEGATIVE
Glucose, UA: NEGATIVE
KETONES UA: NEGATIVE
LEUKOCYTES UA: NEGATIVE
NITRITE UA: NEGATIVE
Protein, UA: NEGATIVE
RBC UA: NEGATIVE
SPEC GRAV UA: 1.016 (ref 1.005–1.030)
Urobilinogen, Ur: 0.2 mg/dL (ref 0.2–1.0)
pH, UA: 5 (ref 5.0–7.5)

## 2017-01-06 LAB — COMPREHENSIVE METABOLIC PANEL
ALK PHOS: 106 IU/L (ref 39–117)
ALT: 39 IU/L — ABNORMAL HIGH (ref 0–32)
AST: 20 IU/L (ref 0–40)
Albumin/Globulin Ratio: 1.5 (ref 1.2–2.2)
Albumin: 4 g/dL (ref 3.5–5.5)
BUN/Creatinine Ratio: 17 (ref 9–23)
BUN: 12 mg/dL (ref 6–24)
Bilirubin Total: 0.2 mg/dL (ref 0.0–1.2)
CO2: 27 mmol/L (ref 18–29)
CREATININE: 0.72 mg/dL (ref 0.57–1.00)
Calcium: 8.7 mg/dL (ref 8.7–10.2)
Chloride: 100 mmol/L (ref 96–106)
GFR calc Af Amer: 108 mL/min/{1.73_m2} (ref >59)
GFR calc non Af Amer: 93 mL/min/{1.73_m2} (ref >59)
GLOBULIN, TOTAL: 2.7 g/dL (ref 1.5–4.5)
GLUCOSE: 85 mg/dL (ref 65–99)
Potassium: 3.9 mmol/L (ref 3.5–5.2)
SODIUM: 143 mmol/L (ref 134–144)
Total Protein: 6.7 g/dL (ref 6.0–8.5)

## 2017-01-06 LAB — CBC
Hematocrit: 40.8 % (ref 34.0–46.6)
Hemoglobin: 13.4 g/dL (ref 11.1–15.9)
MCH: 28.5 pg (ref 26.6–33.0)
MCHC: 32.8 g/dL (ref 31.5–35.7)
MCV: 87 fL (ref 79–97)
PLATELETS: 307 10*3/uL (ref 150–379)
RBC: 4.7 x10E6/uL (ref 3.77–5.28)
RDW: 14.3 % (ref 12.3–15.4)
WBC: 6.4 10*3/uL (ref 3.4–10.8)

## 2017-01-06 LAB — TSH: TSH: 4.28 u[IU]/mL (ref 0.450–4.500)

## 2017-01-25 ENCOUNTER — Other Ambulatory Visit: Payer: Self-pay | Admitting: Internal Medicine

## 2017-02-24 ENCOUNTER — Telehealth: Payer: Self-pay

## 2017-02-24 NOTE — Telephone Encounter (Signed)
Received PAP application from [facility] for Advair and Ventolin placed for provider to sign.

## 2017-02-25 NOTE — Telephone Encounter (Signed)
Placed signed application/script in [facility] folder for pickup. 

## 2017-03-15 ENCOUNTER — Telehealth: Payer: Self-pay | Admitting: Pharmacist

## 2017-03-15 NOTE — Telephone Encounter (Signed)
03/15/17 Faxed Re Enrollment application to [facility] for Advair 500/50 Inhale 1 puff every 12 hours & Ventolin HFA 90 mcg Inhale 2 puffs four times a day.

## 2017-03-25 ENCOUNTER — Telehealth: Payer: Self-pay | Admitting: Pharmacy Technician

## 2017-03-25 NOTE — Telephone Encounter (Signed)
Patient provided 2018 poi.  Approved to receive medication assistance through 2018, as long as eligibility continues to be met.  [facility]

## 2017-04-12 ENCOUNTER — Other Ambulatory Visit: Payer: Self-pay | Admitting: Urology

## 2017-04-12 DIAGNOSIS — E039 Hypothyroidism, unspecified: Secondary | ICD-10-CM

## 2017-04-21 ENCOUNTER — Other Ambulatory Visit: Payer: Self-pay

## 2017-04-21 DIAGNOSIS — Z0000 Encounter for general adult medical examination without abnormal findings: Secondary | ICD-10-CM

## 2017-04-22 LAB — COMPREHENSIVE METABOLIC PANEL
ALBUMIN: 4.1 g/dL (ref 3.5–5.5)
ALK PHOS: 117 IU/L (ref 39–117)
ALT: 87 IU/L — AB (ref 0–32)
AST: 79 IU/L — ABNORMAL HIGH (ref 0–40)
Albumin/Globulin Ratio: 1.2 (ref 1.2–2.2)
BILIRUBIN TOTAL: 0.2 mg/dL (ref 0.0–1.2)
BUN / CREAT RATIO: 17 (ref 9–23)
BUN: 14 mg/dL (ref 6–24)
CHLORIDE: 101 mmol/L (ref 96–106)
CO2: 27 mmol/L (ref 18–29)
CREATININE: 0.83 mg/dL (ref 0.57–1.00)
Calcium: 9.3 mg/dL (ref 8.7–10.2)
GFR calc Af Amer: 90 mL/min/{1.73_m2} (ref >59)
GFR calc non Af Amer: 78 mL/min/{1.73_m2} (ref >59)
GLUCOSE: 99 mg/dL (ref 65–99)
Globulin, Total: 3.4 g/dL (ref 1.5–4.5)
Potassium: 5.6 mmol/L — ABNORMAL HIGH (ref 3.5–5.2)
Sodium: 144 mmol/L (ref 134–144)
TOTAL PROTEIN: 7.5 g/dL (ref 6.0–8.5)

## 2017-04-22 LAB — CBC WITH DIFFERENTIAL
Basophils Absolute: 0 10*3/uL (ref 0.0–0.2)
Basos: 0 %
EOS (ABSOLUTE): 0.2 10*3/uL (ref 0.0–0.4)
Eos: 4 %
HEMOGLOBIN: 13.9 g/dL (ref 11.1–15.9)
Hematocrit: 43.6 % (ref 34.0–46.6)
IMMATURE GRANS (ABS): 0 10*3/uL (ref 0.0–0.1)
IMMATURE GRANULOCYTES: 0 %
LYMPHS: 31 %
Lymphocytes Absolute: 1.5 10*3/uL (ref 0.7–3.1)
MCH: 28.5 pg (ref 26.6–33.0)
MCHC: 31.9 g/dL (ref 31.5–35.7)
MCV: 89 fL (ref 79–97)
MONOCYTES: 11 %
Monocytes Absolute: 0.5 10*3/uL (ref 0.1–0.9)
NEUTROS PCT: 54 %
Neutrophils Absolute: 2.6 10*3/uL (ref 1.4–7.0)
RBC: 4.88 x10E6/uL (ref 3.77–5.28)
RDW: 15.8 % — ABNORMAL HIGH (ref 12.3–15.4)
WBC: 4.7 10*3/uL (ref 3.4–10.8)

## 2017-04-22 LAB — URINALYSIS
BILIRUBIN UA: NEGATIVE
GLUCOSE, UA: NEGATIVE
KETONES UA: NEGATIVE
Leukocytes, UA: NEGATIVE
NITRITE UA: NEGATIVE
Protein, UA: NEGATIVE
RBC UA: NEGATIVE
SPEC GRAV UA: 1.01 (ref 1.005–1.030)
Urobilinogen, Ur: 0.2 mg/dL (ref 0.2–1.0)
pH, UA: 7.5 (ref 5.0–7.5)

## 2017-04-22 LAB — TSH: TSH: 8.48 u[IU]/mL — AB (ref 0.450–4.500)

## 2017-04-28 ENCOUNTER — Ambulatory Visit: Payer: Self-pay | Admitting: Internal Medicine

## 2017-04-28 ENCOUNTER — Ambulatory Visit: Payer: Self-pay | Admitting: Specialist

## 2017-04-28 VITALS — BP 121/79 | HR 63 | Temp 98.3°F | Wt 236.6 lb

## 2017-04-28 DIAGNOSIS — L930 Discoid lupus erythematosus: Secondary | ICD-10-CM

## 2017-04-28 DIAGNOSIS — G5712 Meralgia paresthetica, left lower limb: Secondary | ICD-10-CM

## 2017-04-28 MED ORDER — LEVOTHYROXINE SODIUM 100 MCG PO TABS: ORAL_TABLET | ORAL | 3 refills | Status: DC

## 2017-04-28 MED ORDER — TRIAMCINOLONE ACETONIDE 0.1 % EX CREA
1.0000 | TOPICAL_CREAM | CUTANEOUS | 2 refills | Status: DC | PRN
Start: 2017-04-28 — End: 2021-11-05

## 2017-04-28 MED ORDER — TIOTROPIUM BROMIDE MONOHYDRATE 18 MCG IN CAPS: 18.0000 ug | ORAL_CAPSULE | Freq: Every day | RESPIRATORY_TRACT | 11 refills | Status: DC

## 2017-04-28 MED ORDER — OMEPRAZOLE 20 MG PO CPDR: DELAYED_RELEASE_CAPSULE | ORAL | 3 refills | Status: DC

## 2017-04-28 MED ORDER — SALINE SPRAY 0.65 % NA SOLN: 1.0000 | NASAL | 5 refills | Status: DC | PRN

## 2017-04-28 MED ORDER — MOMETASONE FUROATE 0.1 % EX OINT: TOPICAL_OINTMENT | CUTANEOUS | 2 refills | Status: DC | PRN

## 2017-04-28 MED ORDER — FLUTICASONE-SALMETEROL 500-50 MCG/DOSE IN AEPB: INHALATION_SPRAY | RESPIRATORY_TRACT | 6 refills | Status: DC

## 2017-04-28 NOTE — Progress Notes (Signed)
Subjective:    Patient ID: Isabel Jimenez, female    DOB: 1961/01/26, 56 y.o.   MRN: [id number]  HPI   Pt here for 6 mo f/u. Pt complains of numbness in L thigh to her L buttock for 1 mo. Pt reports her asthma has decreased in flare ups.    Patient Active Problem List   Diagnosis Date Noted  . Acute on chronic respiratory failure with hypoxia ([place]) 04/19/2016  . Acute bronchitis 04/19/2016  . Tobacco abuse counseling 04/19/2016  . COPD exacerbation ([place]) 04/17/2016  . Lupus 01/01/2016  . Hypothyroidism 01/01/2016  . GERD (gastroesophageal reflux disease) 01/01/2016  . COPD (chronic obstructive pulmonary disease) ([place]) 01/01/2016  . Asthma 01/01/2016   Allergies as of 04/28/2017      Reactions   Methotrexate Derivatives Anaphylaxis   Cefdinir    Chantix [varenicline]    rash   Codeine    SOB, itching   Plaquenil [hydroxychloroquine] Other (See Comments)   Hair loss and rash      Medication List       Accurate as of 04/28/17  9:24 AM. Always use your most recent med list.          ADVAIR DISKUS 500-50 MCG/DOSE Aepb Generic drug:  Fluticasone-Salmeterol INHALE 1 PUFF EVERY 12 HOURS.   ADVAIR DISKUS 500-50 MCG/DOSE Aepb Generic drug:  Fluticasone-Salmeterol INHALE 1 PUFF EVERY 12 HOURS.   albuterol 108 (90 Base) MCG/ACT inhaler Commonly known as:  PROVENTIL HFA;VENTOLIN HFA Inhale 1-2 puffs into the lungs every 4 (four) hours as needed for wheezing or shortness of breath.   albuterol (2.5 MG/3ML) 0.083% nebulizer solution Commonly known as:  PROVENTIL Take 2.5 mg by nebulization every 6 (six) hours as needed for wheezing or shortness of breath.   antiseptic oral rinse 0.05 % Liqd solution Commonly known as:  CPC / CETYLPYRIDINIUM CHLORIDE 0.05% 7 mLs by Mouth Rinse route 2 times daily at 12 noon and 4 pm.   azithromycin 250 MG tablet Commonly known as:  ZITHROMAX Take 1 tablet (250 mg total) by mouth daily.   chlorhexidine 0.12 % solution Commonly known  as:  PERIDEX 15 mLs by Mouth Rinse route 2 (two) times daily.   diphenhydrAMINE 25 mg capsule Commonly known as:  BENADRYL Take 2 capsules (50 mg total) by mouth every 6 (six) hours as needed.   docusate sodium 100 MG capsule Commonly known as:  COLACE Take 200 mg by mouth daily.   fluticasone 50 MCG/ACT nasal spray Commonly known as:  FLONASE Place 1 spray into both nostrils daily.   folic acid 1 MG tablet Commonly known as:  FOLVITE Take 1 mg by mouth daily. Reported on 04/27/2016   guaiFENesin 600 MG 12 hr tablet Commonly known as:  MUCINEX Take 2 tablets (1,200 mg total) by mouth 2 (two) times daily.   levothyroxine 100 MCG tablet Commonly known as:  SYNTHROID, LEVOTHROID TAKE ONE TABLET BY MOUTH EVERY DAY FOR THYROID   mometasone 0.1 % ointment Commonly known as:  ELOCON Apply topically as needed.   nicotine 21 mg/24hr patch Commonly known as:  NICODERM CQ - dosed in mg/24 hours Place 1 patch (21 mg total) onto the skin daily.   omeprazole 40 MG capsule Commonly known as:  PRILOSEC Take 40 mg by mouth daily.   omeprazole 20 MG capsule Commonly known as:  PRILOSEC TAKE 2 CAPSULES (40MG) BY MOUTH EVERY DAY FOR GERD   predniSONE 10 MG (21) Tbpk tablet Commonly known as:  STERAPRED UNI-PAK 21 TAB Take 1 tablet (10 mg total) by mouth daily. Please take 6 pills/60 mg in the morning on the day of 1, then taper by 1 pill/10 mg every 2 days until finished. Thank you   sodium chloride 0.65 % Soln nasal spray Commonly known as:  OCEAN Place 1 spray into both nostrils as needed for congestion.   sucralfate 1 GM/10ML suspension Commonly known as:  CARAFATE Take 10 mLs (1 g total) by mouth 3 (three) times daily with meals.   tiotropium 18 MCG inhalation capsule Commonly known as:  SPIRIVA Place 18 mcg into inhaler and inhale daily.   triamcinolone cream 0.1 % Commonly known as:  KENALOG Apply 1 application topically as needed.        Review of Systems      Objective:   Physical Exam  Constitutional: She is oriented to person, place, and time.  Cardiovascular: Normal rate, regular rhythm and normal heart sounds.   Pulmonary/Chest: Effort normal and breath sounds normal.  Neurological: She is alert and oriented to person, place, and time.    BP 121/79 (BP Location: Left Arm, Patient Position: Sitting, Cuff Size: Normal)   Pulse 63   Temp 98.3 F (36.8 C) (Oral)   Wt 236 lb 9.6 oz (107.3 kg)   BMI 40.61 kg/m   Chiropractic evaluation confirms Meralgia Paraesthetica in L thigh and lower back.      Assessment & Plan:   Labs in 6 weeks: TSH, Met C, GGT, Sed Rate F/u in 6 mo w/ labs: TSH, Met C, CBC, UA, Lipid Referral to Dr. Vickki Hearing for chiropractic evaluation of numbness in L thigh and lower back

## 2017-04-28 NOTE — Patient Instructions (Signed)
Labs in 6 weeks F/u in 6 mo w/ labs fasting  Dr. Vickki Hearing evaluated for Meralgia Paraesthetica in L thigh and lower back

## 2017-04-29 NOTE — Progress Notes (Signed)
Refer to Dr. Dierdre Highman note on 04/28/17.  Meralgia Paraesthetica

## 2017-05-07 ENCOUNTER — Telehealth: Payer: Self-pay

## 2017-05-07 NOTE — Telephone Encounter (Signed)
-----   Message from Nori Riis, PA-C sent at 05/06/2017  7:51 PM EDT ----- Patient needs to start Synthroid 50 mcg and recheck TSH in one month.

## 2017-05-07 NOTE — Telephone Encounter (Signed)
Called pt with results. PT verbalized understanding. 

## 2017-05-31 ENCOUNTER — Telehealth: Payer: Self-pay | Admitting: Pharmacist

## 2017-05-31 NOTE — Telephone Encounter (Signed)
05/31/17 Placed refill online with [facility] for Advair 500/50 & Ventolin, to release 07/05/17, order# [id number].[name]

## 2017-06-16 ENCOUNTER — Other Ambulatory Visit: Payer: Self-pay

## 2017-06-16 DIAGNOSIS — L930 Discoid lupus erythematosus: Secondary | ICD-10-CM

## 2017-06-17 ENCOUNTER — Other Ambulatory Visit: Payer: Self-pay | Admitting: Adult Health Nurse Practitioner

## 2017-06-17 LAB — COMPREHENSIVE METABOLIC PANEL
A/G RATIO: 1.4 (ref 1.2–2.2)
ALK PHOS: 108 IU/L (ref 39–117)
ALT: 36 IU/L — ABNORMAL HIGH (ref 0–32)
AST: 32 IU/L (ref 0–40)
Albumin: 4.1 g/dL (ref 3.5–5.5)
BILIRUBIN TOTAL: 0.2 mg/dL (ref 0.0–1.2)
BUN / CREAT RATIO: 17 (ref 9–23)
BUN: 15 mg/dL (ref 6–24)
CHLORIDE: 101 mmol/L (ref 96–106)
CO2: 24 mmol/L (ref 20–29)
Calcium: 9 mg/dL (ref 8.7–10.2)
Creatinine, Ser: 0.88 mg/dL (ref 0.57–1.00)
GFR calc non Af Amer: 73 mL/min/{1.73_m2} (ref >59)
GFR, EST AFRICAN AMERICAN: 84 mL/min/{1.73_m2} (ref >59)
GLUCOSE: 99 mg/dL (ref 65–99)
Globulin, Total: 2.9 g/dL (ref 1.5–4.5)
POTASSIUM: 4.3 mmol/L (ref 3.5–5.2)
Sodium: 142 mmol/L (ref 134–144)
TOTAL PROTEIN: 7 g/dL (ref 6.0–8.5)

## 2017-06-17 LAB — GAMMA GT: GGT: 45 IU/L (ref 0–60)

## 2017-06-17 LAB — TSH: TSH: 59.45 u[IU]/mL — AB (ref 0.450–4.500)

## 2017-06-17 MED ORDER — LEVOTHYROXINE SODIUM 125 MCG PO TABS: ORAL_TABLET | ORAL | 1 refills | Status: DC

## 2017-06-18 ENCOUNTER — Telehealth: Payer: Self-pay

## 2017-06-18 NOTE — Telephone Encounter (Signed)
SW patient she was only taking 50 mcg of Levothyroxine [place] and we are to increase to 100 mcg not 125 mcg.

## 2017-06-18 NOTE — Telephone Encounter (Signed)
-----   Message from Staci Acosta, NP sent at 06/17/2017  6:56 PM EDT ----- Thyroid level is very high. Is she taking levothyroxine 155mcg? If so she needs to increase to 161mcg and FU in 6 weeks for repeat testing. Otherwise labs stable. Rx sent.

## 2017-07-15 ENCOUNTER — Other Ambulatory Visit: Payer: Self-pay | Admitting: Internal Medicine

## 2017-08-04 ENCOUNTER — Other Ambulatory Visit: Payer: Self-pay

## 2017-08-04 DIAGNOSIS — L930 Discoid lupus erythematosus: Secondary | ICD-10-CM

## 2017-08-04 NOTE — Progress Notes (Unsigned)
Patient is currently 1/2 of a 125 mcg tablet for the last 4 days. Previously on 100 mcg approx 45 days or since last blood draw. Currently has a 90 day supply of 165mcg.

## 2017-08-05 LAB — TSH: TSH: 4.85 u[IU]/mL — ABNORMAL HIGH (ref 0.450–4.500)

## 2017-08-31 ENCOUNTER — Telehealth: Payer: Self-pay | Admitting: Pharmacist

## 2017-08-31 NOTE — Telephone Encounter (Signed)
08/31/17 Placed refill online with [facility] for Advair & Ventolin, to release 09/13/17, order# [id number].[name]

## 2017-09-02 ENCOUNTER — Telehealth: Payer: Self-pay | Admitting: Pharmacist

## 2017-09-02 NOTE — Telephone Encounter (Signed)
09/02/17 Called [facility] to place refill on Spiriva, will release order 09/24/17, allow 7-10 business days from 09/24/17 to receive medication.Isabel Jimenez

## 2017-09-22 ENCOUNTER — Other Ambulatory Visit: Payer: Self-pay | Admitting: Internal Medicine

## 2017-10-05 ENCOUNTER — Telehealth: Payer: Self-pay | Admitting: Adult Health Nurse Practitioner

## 2017-10-05 NOTE — Telephone Encounter (Signed)
Wanted to reschedule appt. Called back

## 2017-10-10 IMAGING — CR DG CHEST 2V
2 series · 2 of 2 positions shown · non-contrast
Comparison: January 11, 2012

CLINICAL DATA: Smoker.  COPD.  Shortness of breath.

EXAM:
CHEST  2 VIEW

[chest pa]
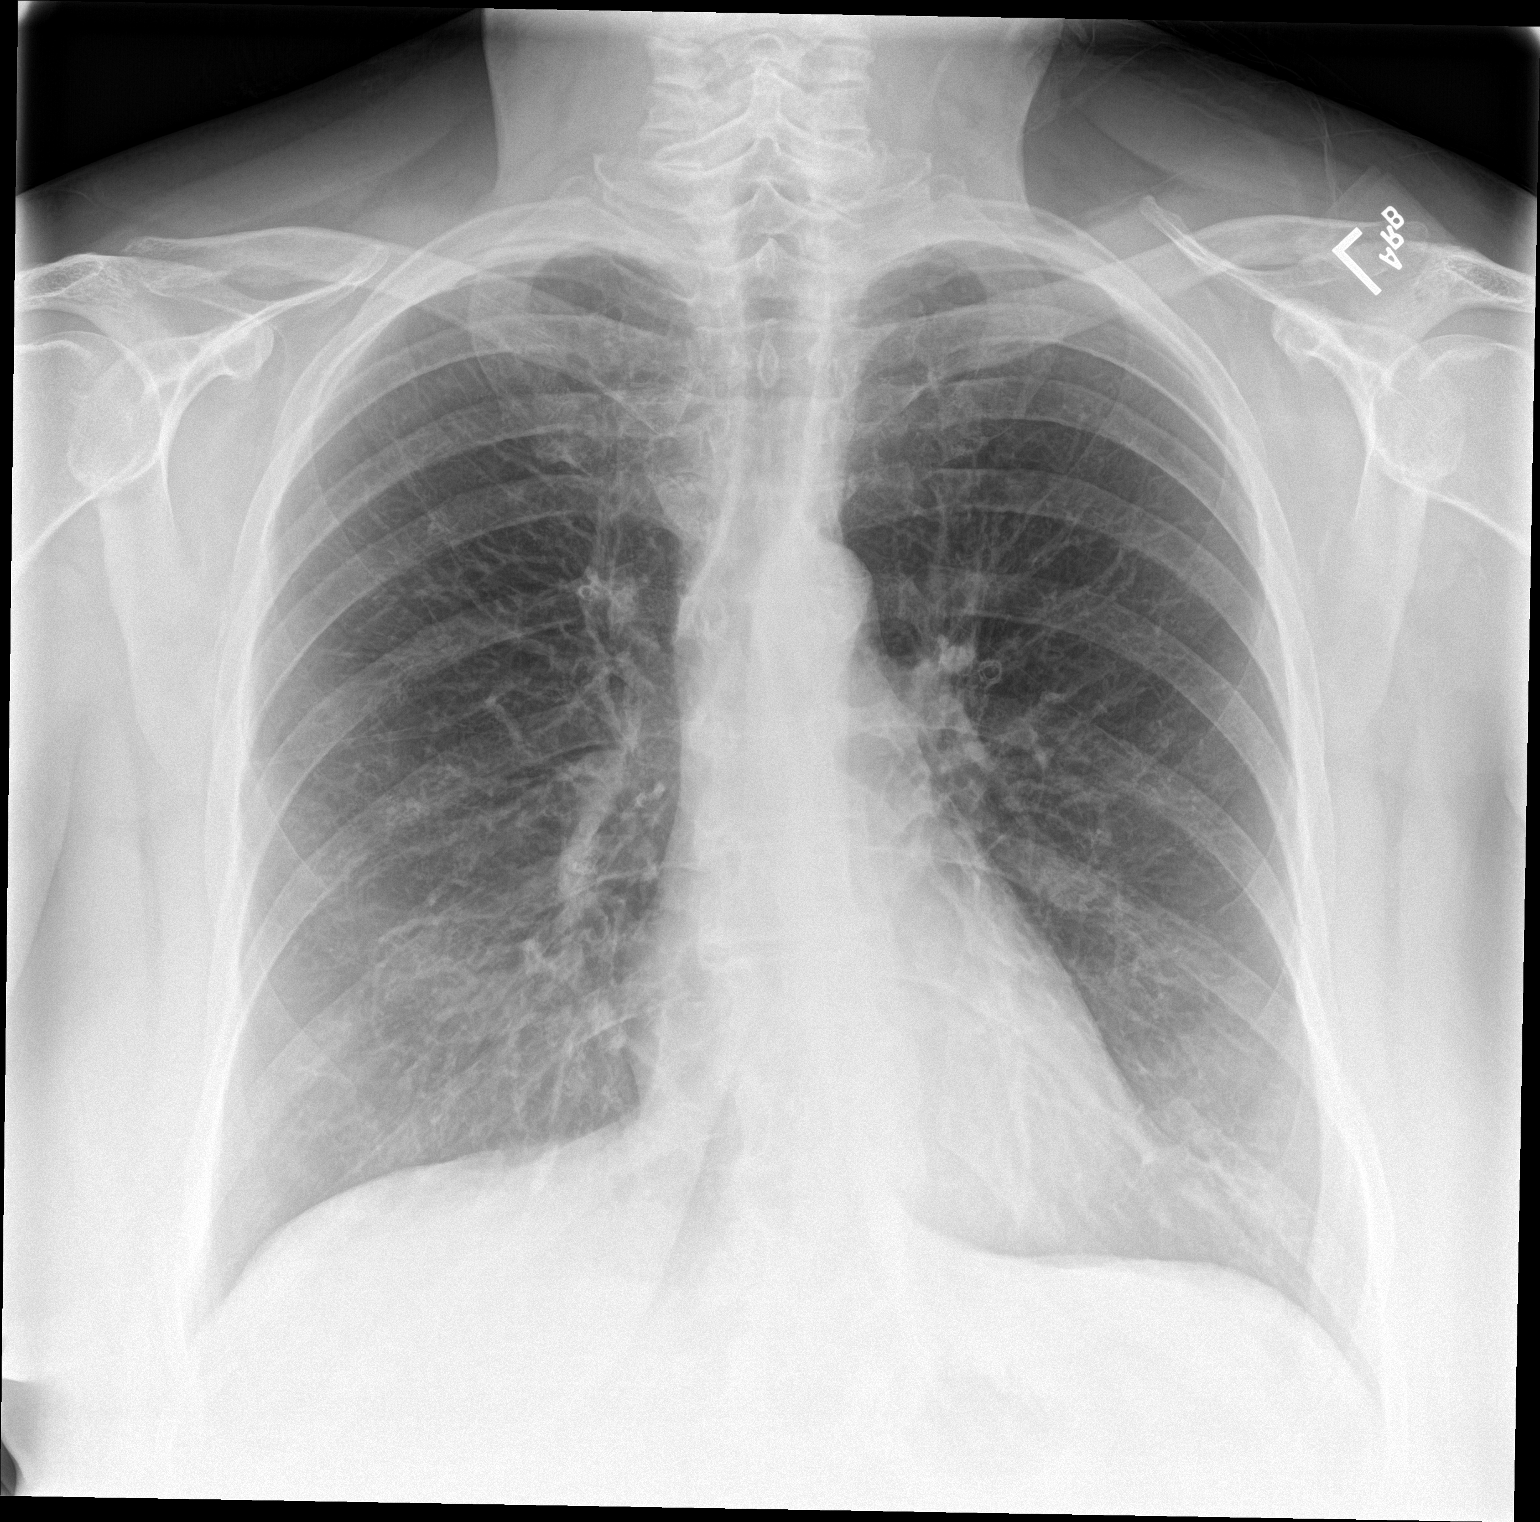

[chest lat]
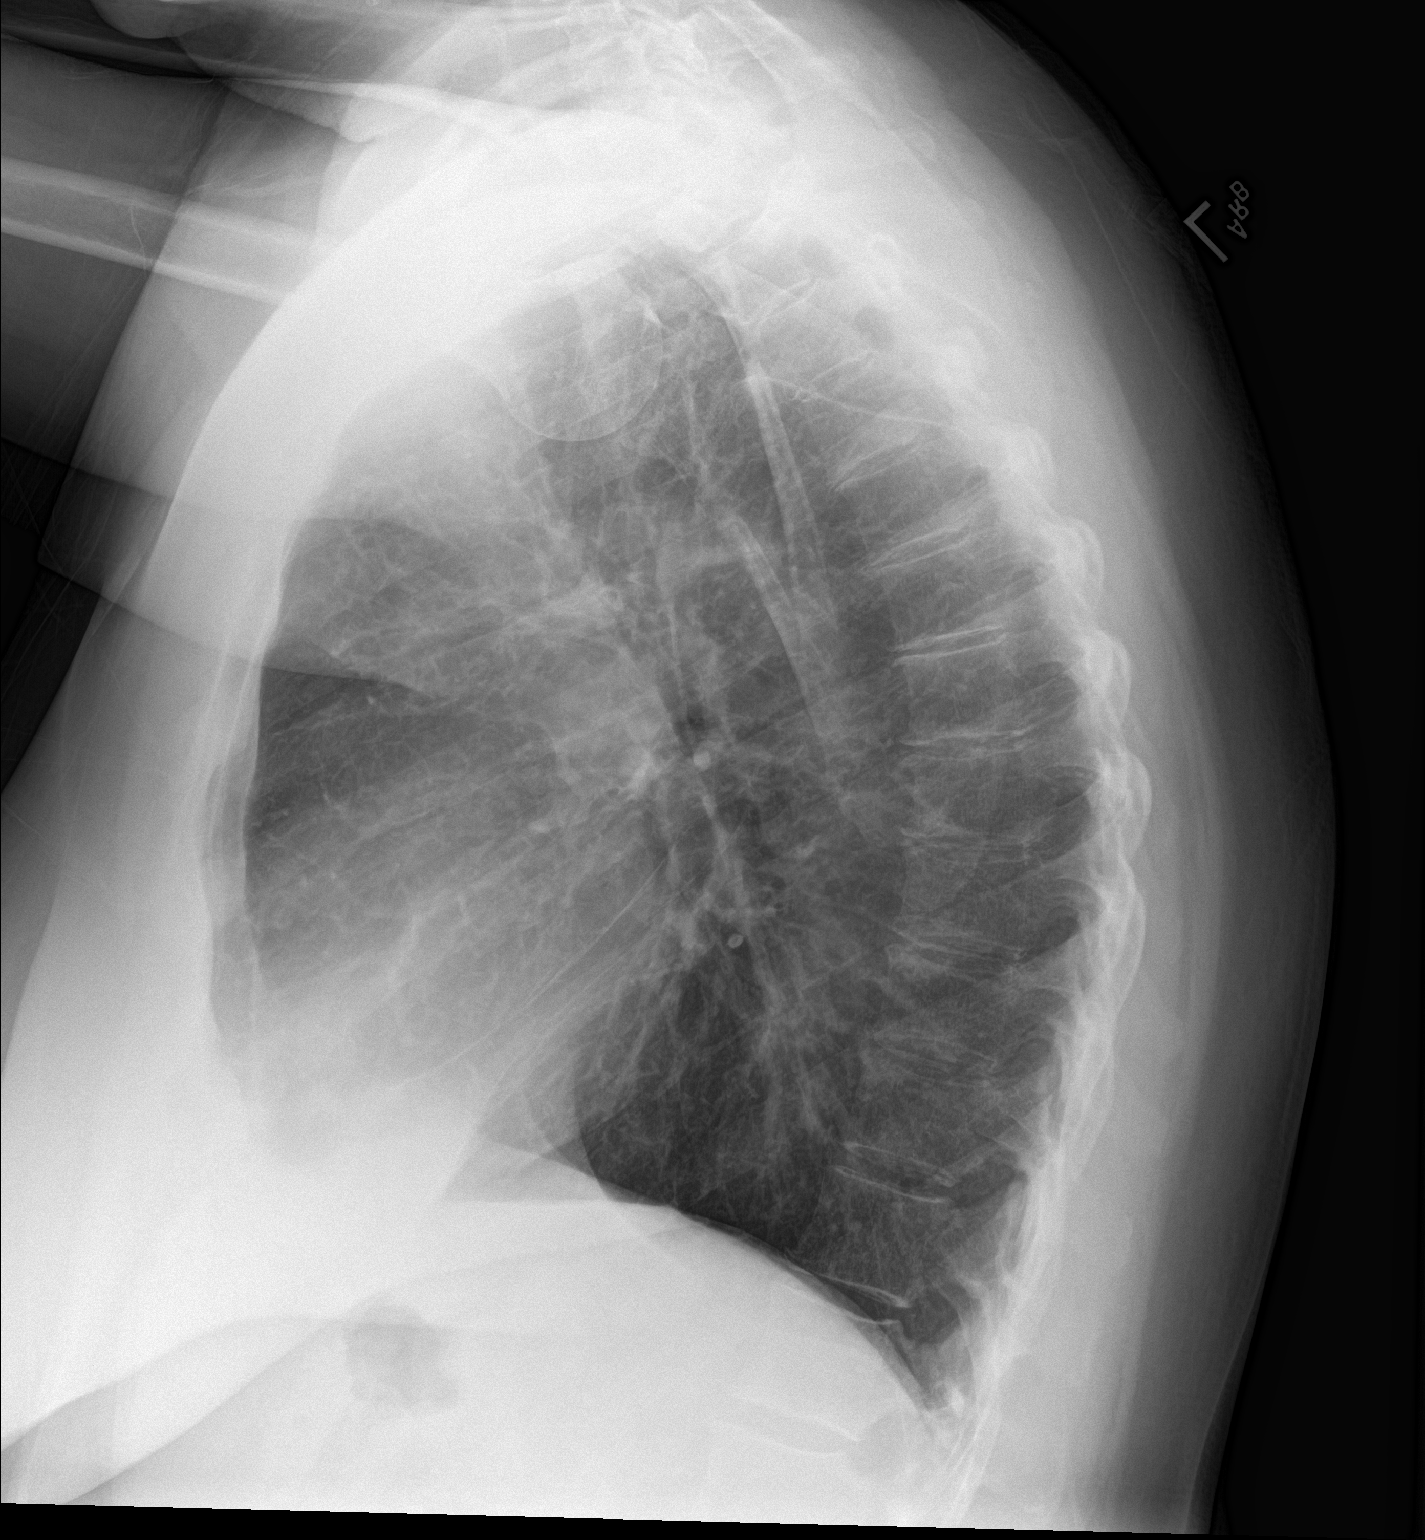

[2 of 2 positions shown; findings below may reference images not displayed]

FINDINGS: The heart size and mediastinal contours are within normal limits.
Both lungs are clear. The visualized skeletal structures are
unremarkable.
IMPRESSION: No active cardiopulmonary disease.

## 2017-10-13 ENCOUNTER — Other Ambulatory Visit: Payer: Self-pay

## 2017-10-13 DIAGNOSIS — Z0000 Encounter for general adult medical examination without abnormal findings: Secondary | ICD-10-CM

## 2017-10-14 LAB — CBC WITH DIFFERENTIAL
BASOS: 1 %
Basophils Absolute: 0 10*3/uL (ref 0.0–0.2)
EOS (ABSOLUTE): 0.1 10*3/uL (ref 0.0–0.4)
EOS: 2 %
HEMATOCRIT: 39.4 % (ref 34.0–46.6)
HEMOGLOBIN: 13.3 g/dL (ref 11.1–15.9)
IMMATURE GRANS (ABS): 0 10*3/uL (ref 0.0–0.1)
Immature Granulocytes: 0 %
LYMPHS: 34 %
Lymphocytes Absolute: 1.3 10*3/uL (ref 0.7–3.1)
MCH: 30.6 pg (ref 26.6–33.0)
MCHC: 33.8 g/dL (ref 31.5–35.7)
MCV: 91 fL (ref 79–97)
MONOS ABS: 0.5 10*3/uL (ref 0.1–0.9)
Monocytes: 12 %
NEUTROS ABS: 2 10*3/uL (ref 1.4–7.0)
NRBC: 1 % — ABNORMAL HIGH (ref 0–0)
Neutrophils: 51 %
RBC: 4.35 x10E6/uL (ref 3.77–5.28)
RDW: 13.8 % (ref 12.3–15.4)
WBC: 3.9 10*3/uL (ref 3.4–10.8)

## 2017-10-14 LAB — URINALYSIS
BILIRUBIN UA: NEGATIVE
Glucose, UA: NEGATIVE
Ketones, UA: NEGATIVE
Leukocytes, UA: NEGATIVE
NITRITE UA: NEGATIVE
PH UA: 5 (ref 5.0–7.5)
Protein, UA: NEGATIVE
RBC UA: NEGATIVE
SPEC GRAV UA: 1.015 (ref 1.005–1.030)
Urobilinogen, Ur: 0.2 mg/dL (ref 0.2–1.0)

## 2017-10-14 LAB — LIPID PANEL
CHOLESTEROL TOTAL: 182 mg/dL (ref 100–199)
Chol/HDL Ratio: 2.9 ratio (ref 0.0–4.4)
HDL: 62 mg/dL (ref >39)
LDL Calculated: 97 mg/dL (ref 0–99)
TRIGLYCERIDES: 114 mg/dL (ref 0–149)
VLDL CHOLESTEROL CAL: 23 mg/dL (ref 5–40)

## 2017-10-14 LAB — COMPREHENSIVE METABOLIC PANEL
ALBUMIN: 4 g/dL (ref 3.5–5.5)
ALK PHOS: 102 IU/L (ref 39–117)
ALT: 31 IU/L (ref 0–32)
AST: 25 IU/L (ref 0–40)
Albumin/Globulin Ratio: 1.4 (ref 1.2–2.2)
BUN / CREAT RATIO: 16 (ref 9–23)
BUN: 14 mg/dL (ref 6–24)
Bilirubin Total: 0.3 mg/dL (ref 0.0–1.2)
CALCIUM: 8.8 mg/dL (ref 8.7–10.2)
CO2: 24 mmol/L (ref 20–29)
CREATININE: 0.87 mg/dL (ref 0.57–1.00)
Chloride: 105 mmol/L (ref 96–106)
GFR calc Af Amer: 85 mL/min/{1.73_m2} (ref >59)
GFR, EST NON AFRICAN AMERICAN: 74 mL/min/{1.73_m2} (ref >59)
GLOBULIN, TOTAL: 2.9 g/dL (ref 1.5–4.5)
GLUCOSE: 93 mg/dL (ref 65–99)
Potassium: 4.7 mmol/L (ref 3.5–5.2)
Sodium: 144 mmol/L (ref 134–144)
TOTAL PROTEIN: 6.9 g/dL (ref 6.0–8.5)

## 2017-10-14 LAB — TSH: TSH: 0.439 u[IU]/mL — ABNORMAL LOW (ref 0.450–4.500)

## 2017-10-20 ENCOUNTER — Ambulatory Visit: Payer: Self-pay | Admitting: Internal Medicine

## 2017-10-27 ENCOUNTER — Other Ambulatory Visit: Payer: Self-pay | Admitting: Internal Medicine

## 2017-11-03 ENCOUNTER — Ambulatory Visit: Payer: Self-pay | Admitting: Internal Medicine

## 2017-11-03 VITALS — BP 144/82 | HR 69 | Temp 97.7°F | Wt 230.3 lb

## 2017-11-03 DIAGNOSIS — E0590 Thyrotoxicosis, unspecified without thyrotoxic crisis or storm: Secondary | ICD-10-CM

## 2017-11-03 IMAGING — CR DG CHEST 2V
2 series · 2 of 2 positions shown · non-contrast
Comparison: 03/14/2016

CLINICAL DATA: Shortness of breath, productive cough

EXAM:
CHEST  2 VIEW

[chest pa]
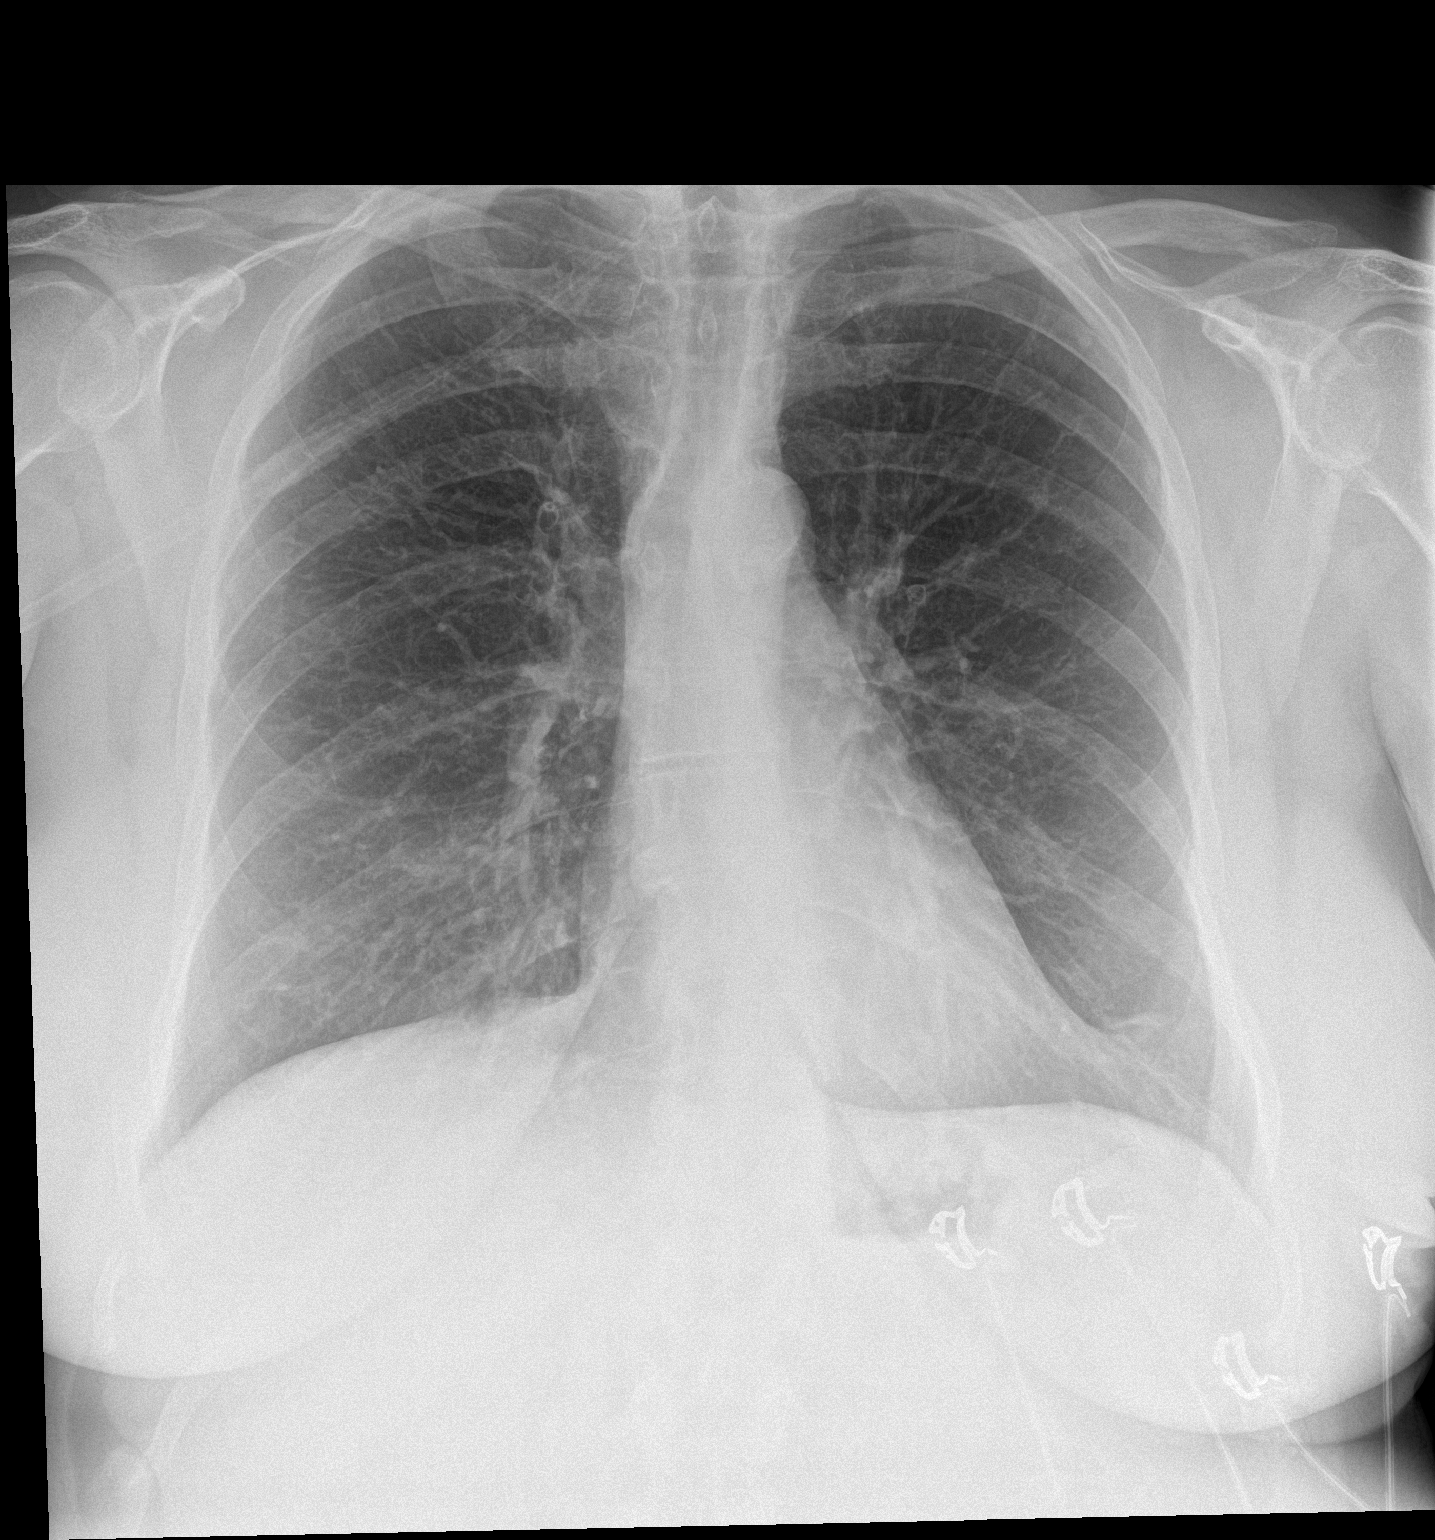

[chest lat]
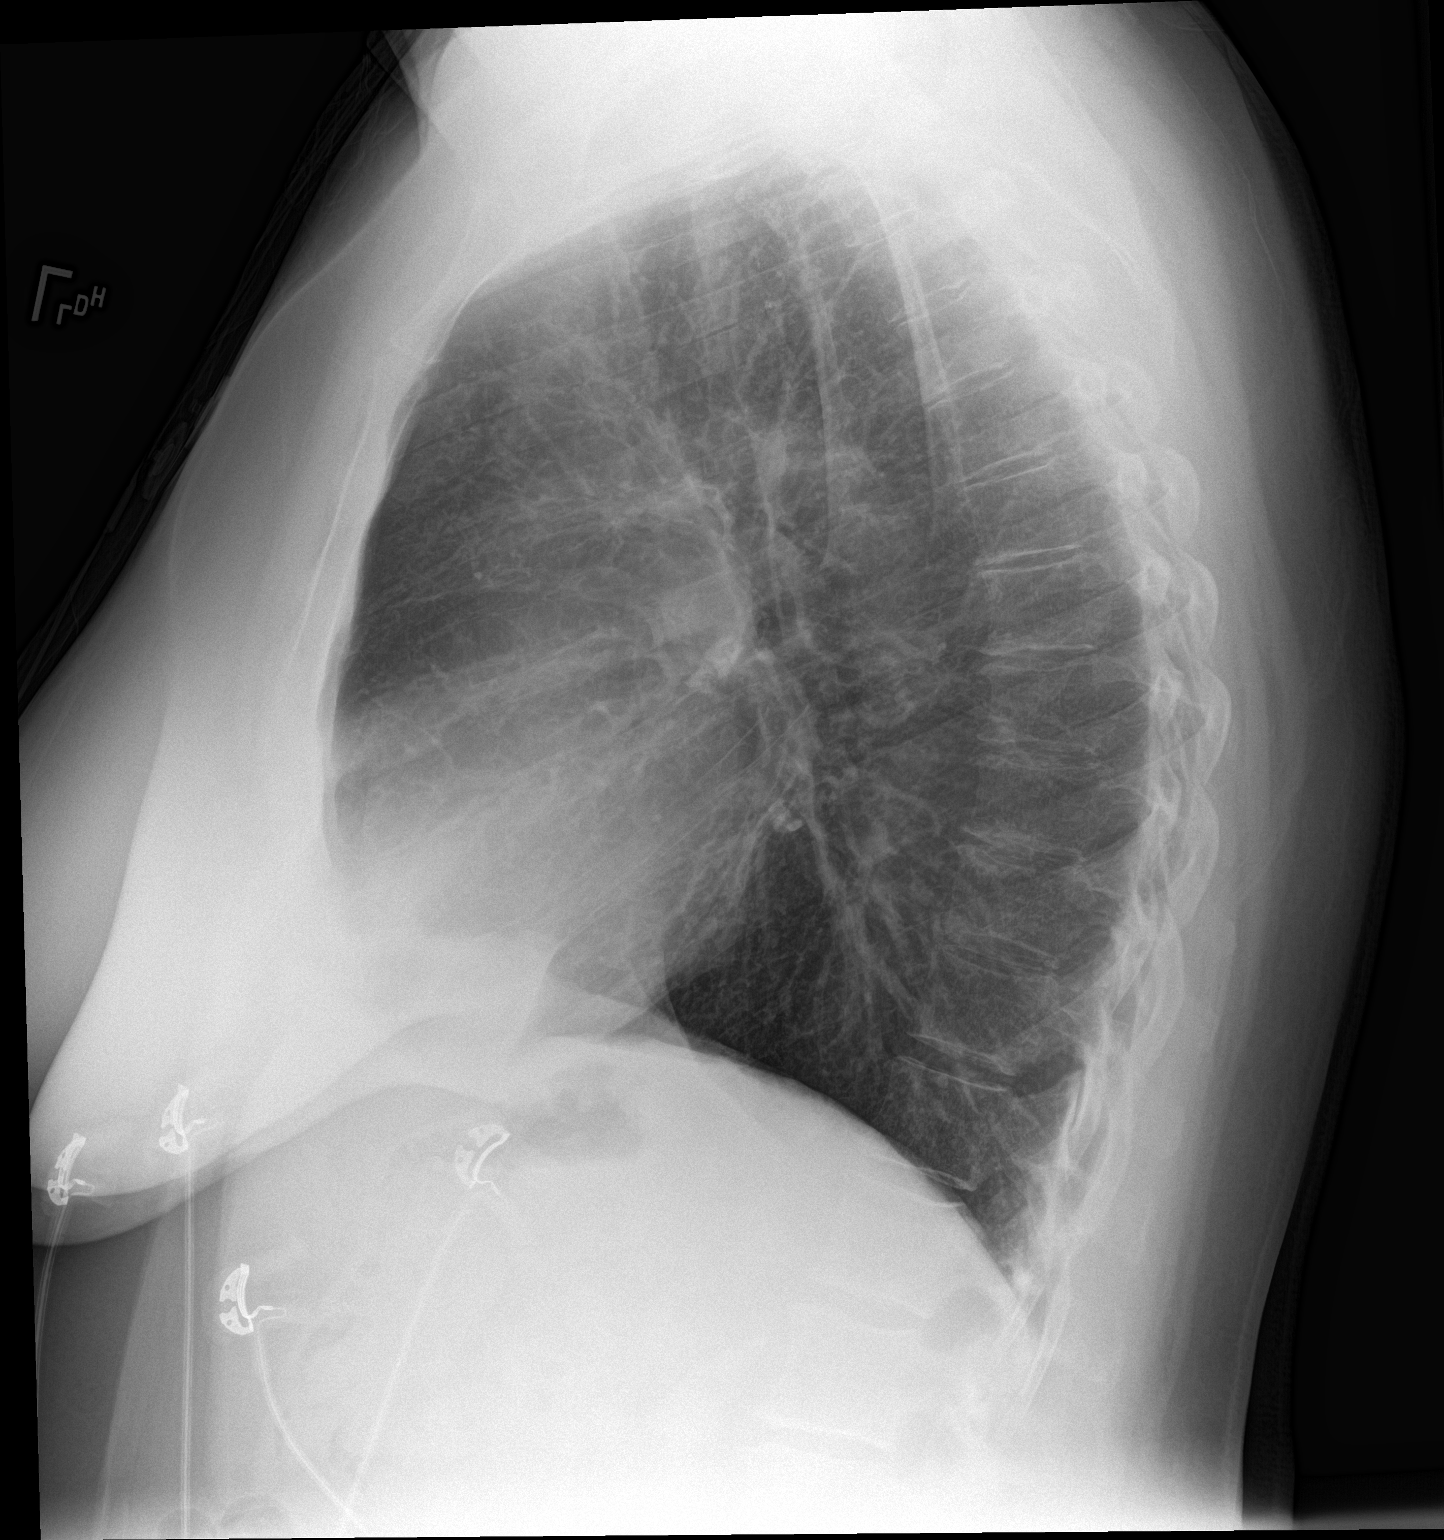

[2 of 2 positions shown; findings below may reference images not displayed]

FINDINGS: Cardiomediastinal silhouette is stable. No acute infiltrate or
pleural effusion. No pulmonary edema. Stable mild degenerative
changes mid thoracic spine.
IMPRESSION: No active cardiopulmonary disease.

## 2017-11-03 NOTE — Progress Notes (Signed)
Subjective:    Patient ID: Isabel Jimenez, female    DOB: 1961-06-13, 56 y.o.   MRN: [id number]  HPI  Patient Active Problem List   Diagnosis Date Noted  . Acute on chronic respiratory failure with hypoxia ([place]) 04/19/2016  . Acute bronchitis 04/19/2016  . Tobacco abuse counseling 04/19/2016  . COPD exacerbation ([place]) 04/17/2016  . Lupus 01/01/2016  . Hypothyroidism 01/01/2016  . GERD (gastroesophageal reflux disease) 01/01/2016  . COPD (chronic obstructive pulmonary disease) ([place]) 01/01/2016  . Asthma 01/01/2016   Patient has some nasal congestion and is using a nettie pot to relieve symptoms.  Review of Systems Labs look good.  TSH is slightly low.  Patient states that she is currently taking 125mg  Levothyroxine.     Objective:   Physical Exam  Constitutional: She is oriented to person, place, and time.  Cardiovascular: Normal rate, regular rhythm and normal heart sounds.  Pulmonary/Chest: Effort normal and breath sounds normal.  Neurological: She is alert and oriented to person, place, and time.   There were no vitals taken for this visit. Allergies as of 11/03/2017      Reactions   Methotrexate Derivatives Anaphylaxis   Cefdinir    Chantix [varenicline]    rash   Codeine    SOB, itching   Plaquenil [hydroxychloroquine] Other (See Comments)   Hair loss and rash      Medication List        Accurate as of 11/03/17 10:24 AM. Always use your most recent med list.          ADVAIR DISKUS 500-50 MCG/DOSE Aepb Generic drug:  Fluticasone-Salmeterol INHALE 1 PUFF EVERY 12 HOURS.   Fluticasone-Salmeterol 500-50 MCG/DOSE Aepb Commonly known as:  ADVAIR DISKUS INHALE 1 PUFF EVERY 12 HOURS.   albuterol 108 (90 Base) MCG/ACT inhaler Commonly known as:  PROVENTIL HFA;VENTOLIN HFA Inhale 1-2 puffs into the lungs every 4 (four) hours as needed for wheezing or shortness of breath.   antiseptic oral rinse 0.05 % Liqd solution Commonly known as:  CPC / CETYLPYRIDINIUM  CHLORIDE 0.05% 7 mLs by Mouth Rinse route 2 times daily at 12 noon and 4 pm.   azithromycin 250 MG tablet Commonly known as:  ZITHROMAX Take 1 tablet (250 mg total) by mouth daily.   chlorhexidine 0.12 % solution Commonly known as:  PERIDEX 15 mLs by Mouth Rinse route 2 (two) times daily.   diphenhydrAMINE 25 mg capsule Commonly known as:  BENADRYL Take 2 capsules (50 mg total) by mouth every 6 (six) hours as needed.   docusate sodium 100 MG capsule Commonly known as:  COLACE Take 200 mg by mouth daily.   fluticasone 50 MCG/ACT nasal spray Commonly known as:  FLONASE Place 1 spray into both nostrils daily.   folic acid 1 MG tablet Commonly known as:  FOLVITE Take 1 mg by mouth daily. Reported on 04/27/2016   guaiFENesin 600 MG 12 hr tablet Commonly known as:  MUCINEX Take 2 tablets (1,200 mg total) by mouth 2 (two) times daily.   levothyroxine 125 MCG tablet Commonly known as:  SYNTHROID, LEVOTHROID TAKE ONE TABLET BY MOUTH EVERY DAY FOR THYROID   mometasone 0.1 % ointment Commonly known as:  ELOCON Apply topically as needed.   nicotine 21 mg/24hr patch Commonly known as:  NICODERM CQ - dosed in mg/24 hours Place 1 patch (21 mg total) onto the skin daily.   omeprazole 20 MG capsule Commonly known as:  PRILOSEC TAKE 2 CAPSULES (40MG ) BY MOUTH  EVERY DAY FOR GERD   predniSONE 10 MG (21) Tbpk tablet Commonly known as:  STERAPRED UNI-PAK 21 TAB Take 1 tablet (10 mg total) by mouth daily. Please take 6 pills/60 mg in the morning on the day of 1, then taper by 1 pill/10 mg every 2 days until finished. Thank you   sodium chloride 0.65 % Soln nasal spray Commonly known as:  OCEAN Place 1 spray into both nostrils as needed for congestion.   sucralfate 1 GM/10ML suspension Commonly known as:  CARAFATE Take 10 mLs (1 g total) by mouth 3 (three) times daily with meals.   tiotropium 18 MCG inhalation capsule Commonly known as:  SPIRIVA Place 1 capsule (18 mcg total) into  inhaler and inhale daily.   triamcinolone cream 0.1 % Commonly known as:  KENALOG Apply 1 application topically as needed.             Assessment & Plan:  Patient could continue Levothyroxine 125mg  and have TSH repeated in January to assess dosage of Levothyroxine.  Follow up with physician in 6 months.

## 2017-11-08 ENCOUNTER — Telehealth: Payer: Self-pay | Admitting: Pharmacist

## 2017-11-08 NOTE — Telephone Encounter (Signed)
11/08/17 Called [facility] for refill, to release 11/21/17, order# [id number] to patient's home, allow 7-10 business days from 11/21/17. This will leave patient 1 refill.Isabel Jimenez

## 2017-11-11 ENCOUNTER — Telehealth: Payer: Self-pay | Admitting: Pharmacist

## 2017-11-11 NOTE — Telephone Encounter (Signed)
11/11/17 Received signed script back from provider-Christina MacRosty for Trelegy Ellipta 100/1-65.5/1-25/101mcg. I have faxed script to [place] [facility] to process, Patient ID# [id number], this patient is already enrolled with [facility]. This medication will replace Advair & Spiriva per note from [place] on pharmacy printout.Isabel Jimenez

## 2017-12-15 ENCOUNTER — Other Ambulatory Visit: Payer: Self-pay

## 2017-12-15 DIAGNOSIS — E0590 Thyrotoxicosis, unspecified without thyrotoxic crisis or storm: Secondary | ICD-10-CM

## 2017-12-17 ENCOUNTER — Telehealth: Payer: Self-pay | Admitting: Pharmacist

## 2017-12-17 NOTE — Telephone Encounter (Signed)
12/17/17 Faxed a script to [facility] for refill-Ventolin HFA 64mcg Inhale 2 puffs four times a day.[name]

## 2017-12-23 LAB — TSH: TSH: 0.54 u[IU]/mL (ref 0.450–4.500)

## 2017-12-26 ENCOUNTER — Other Ambulatory Visit (INDEPENDENT_AMBULATORY_CARE_PROVIDER_SITE_OTHER): Payer: Self-pay

## 2017-12-27 ENCOUNTER — Other Ambulatory Visit: Payer: Self-pay | Admitting: Internal Medicine

## 2017-12-28 ENCOUNTER — Telehealth: Payer: Self-pay

## 2017-12-28 NOTE — Telephone Encounter (Signed)
Isabel Jimenez called [facility] to verify she is taking 125 mcg of Levothyroxine daily.  Patient verified dosage.  Isabel told her to continue taking her medication her TSH is perfect.

## 2018-01-11 ENCOUNTER — Telehealth: Payer: Self-pay | Admitting: Pharmacist

## 2018-01-11 NOTE — Telephone Encounter (Signed)
01/11/18 Placed refill for Trelegy Ellipta online with [facility]-will release 02/15/18, order# [id number].[name]

## 2018-01-19 ENCOUNTER — Telehealth: Payer: Self-pay | Admitting: Pharmacist

## 2018-01-19 NOTE — Telephone Encounter (Signed)
01/19/18 Received notice from [facility] that patient enrollment ends 03/14/18, printed renewal application, mailing patient her portion to sign & return with current income & taxes. Also sending script to [facility] for Ventolin HFA.Isabel Jimenez

## 2018-01-24 ENCOUNTER — Other Ambulatory Visit: Payer: Self-pay | Admitting: Internal Medicine

## 2018-02-04 ENCOUNTER — Telehealth: Payer: Self-pay | Admitting: Pharmacist

## 2018-02-04 NOTE — Telephone Encounter (Signed)
02/04/2018 8:48:41 AM - Ventolin HFA  02/04/18 I have received the signed script for Ventolin HFA back from provider, holding for patient to return her portion mailed to her 01/19/18.[name]

## 2018-02-10 ENCOUNTER — Telehealth: Payer: Self-pay | Admitting: Pharmacist

## 2018-02-10 NOTE — Telephone Encounter (Signed)
02/10/2018 8:43:17 AM - [place] Renewal  02/10/18 Faxed [place] Renewal application for Ventolin HFA & Trelegy Ellipta.[name]

## 2018-03-18 ENCOUNTER — Telehealth: Payer: Self-pay | Admitting: Pharmacy Technician

## 2018-03-18 NOTE — Telephone Encounter (Signed)
Received updated proof of income.  Patient eligible to receive medication assistance at [facility] through 2019, as long as eligibility requirements continue to be met.  [facility]

## 2018-03-25 ENCOUNTER — Other Ambulatory Visit: Payer: Self-pay | Admitting: Adult Health Nurse Practitioner

## 2018-04-04 ENCOUNTER — Telehealth: Payer: Self-pay | Admitting: Pharmacist

## 2018-04-04 NOTE — Telephone Encounter (Signed)
04/04/2018 10:54:00 AM - Trelegy Ellipta refill  04/04/18 Placed refill online with [facility] for Trelegy Ellipta, to release 04/15/18, order# [id number].Isabel Jimenez

## 2018-04-13 ENCOUNTER — Other Ambulatory Visit: Payer: Self-pay | Admitting: Internal Medicine

## 2018-04-13 MED ORDER — FLUTICASONE PROPIONATE 50 MCG/ACT NA SUSP: 1.0000 | Freq: Every day | NASAL | 0 refills | Status: DC

## 2018-04-22 ENCOUNTER — Other Ambulatory Visit: Payer: Self-pay | Admitting: Adult Health Nurse Practitioner

## 2018-04-27 ENCOUNTER — Other Ambulatory Visit: Payer: Self-pay

## 2018-04-27 DIAGNOSIS — Z0000 Encounter for general adult medical examination without abnormal findings: Secondary | ICD-10-CM

## 2018-04-27 NOTE — Addendum Note (Signed)
Addended by: Colan Neptune E on: 04/27/2018 09:57 AM   Modules accepted: Orders

## 2018-04-28 LAB — COMPREHENSIVE METABOLIC PANEL
ALBUMIN: 4 g/dL (ref 3.5–5.5)
ALT: 28 IU/L (ref 0–32)
AST: 21 IU/L (ref 0–40)
Albumin/Globulin Ratio: 1.4 (ref 1.2–2.2)
Alkaline Phosphatase: 103 IU/L (ref 39–117)
BUN / CREAT RATIO: 16 (ref 9–23)
BUN: 13 mg/dL (ref 6–24)
Bilirubin Total: 0.3 mg/dL (ref 0.0–1.2)
CALCIUM: 9 mg/dL (ref 8.7–10.2)
CO2: 25 mmol/L (ref 20–29)
CREATININE: 0.79 mg/dL (ref 0.57–1.00)
Chloride: 103 mmol/L (ref 96–106)
GFR calc Af Amer: 95 mL/min/{1.73_m2} (ref >59)
GFR calc non Af Amer: 82 mL/min/{1.73_m2} (ref >59)
GLUCOSE: 87 mg/dL (ref 65–99)
Globulin, Total: 2.9 g/dL (ref 1.5–4.5)
Potassium: 4.3 mmol/L (ref 3.5–5.2)
Sodium: 142 mmol/L (ref 134–144)
Total Protein: 6.9 g/dL (ref 6.0–8.5)

## 2018-04-28 LAB — CBC WITH DIFFERENTIAL
BASOS ABS: 0 10*3/uL (ref 0.0–0.2)
Basos: 1 %
EOS (ABSOLUTE): 0.1 10*3/uL (ref 0.0–0.4)
Eos: 2 %
HEMOGLOBIN: 14 g/dL (ref 11.1–15.9)
Hematocrit: 41.1 % (ref 34.0–46.6)
IMMATURE GRANS (ABS): 0 10*3/uL (ref 0.0–0.1)
IMMATURE GRANULOCYTES: 0 %
LYMPHS: 29 %
Lymphocytes Absolute: 1.1 10*3/uL (ref 0.7–3.1)
MCH: 30.3 pg (ref 26.6–33.0)
MCHC: 34.1 g/dL (ref 31.5–35.7)
MCV: 89 fL (ref 79–97)
Monocytes Absolute: 0.5 10*3/uL (ref 0.1–0.9)
Monocytes: 13 %
Neutrophils Absolute: 2.1 10*3/uL (ref 1.4–7.0)
Neutrophils: 55 %
RBC: 4.62 x10E6/uL (ref 3.77–5.28)
RDW: 15.6 % — ABNORMAL HIGH (ref 12.3–15.4)
WBC: 3.7 10*3/uL (ref 3.4–10.8)

## 2018-04-28 LAB — LIPID PANEL
CHOLESTEROL TOTAL: 221 mg/dL — AB (ref 100–199)
Chol/HDL Ratio: 3.1 ratio (ref 0.0–4.4)
HDL: 71 mg/dL (ref >39)
LDL Calculated: 129 mg/dL — ABNORMAL HIGH (ref 0–99)
Triglycerides: 104 mg/dL (ref 0–149)
VLDL CHOLESTEROL CAL: 21 mg/dL (ref 5–40)

## 2018-04-28 LAB — URINALYSIS
BILIRUBIN UA: NEGATIVE
Glucose, UA: NEGATIVE
Ketones, UA: NEGATIVE
LEUKOCYTES UA: NEGATIVE
NITRITE UA: NEGATIVE
PH UA: 5 (ref 5.0–7.5)
Protein, UA: NEGATIVE
RBC UA: NEGATIVE
Specific Gravity, UA: 1.012 (ref 1.005–1.030)
Urobilinogen, Ur: 0.2 mg/dL (ref 0.2–1.0)

## 2018-04-28 LAB — TSH: TSH: 0.841 u[IU]/mL (ref 0.450–4.500)

## 2018-05-04 ENCOUNTER — Ambulatory Visit: Payer: Self-pay | Admitting: Internal Medicine

## 2018-05-11 ENCOUNTER — Ambulatory Visit: Payer: Self-pay | Admitting: Internal Medicine

## 2018-05-11 ENCOUNTER — Encounter: Payer: Self-pay | Admitting: Internal Medicine

## 2018-05-11 VITALS — BP 141/81 | HR 74 | Temp 97.8°F | Wt 226.6 lb

## 2018-05-11 DIAGNOSIS — K760 Fatty (change of) liver, not elsewhere classified: Secondary | ICD-10-CM | POA: Insufficient documentation

## 2018-05-11 DIAGNOSIS — E0590 Thyrotoxicosis, unspecified without thyrotoxic crisis or storm: Secondary | ICD-10-CM

## 2018-05-11 NOTE — Progress Notes (Signed)
Subjective:    Patient ID: Isabel Jimenez, female    DOB: 1961/03/02, 57 y.o.   MRN: [id number]  HPI   6 MO FU  Has recently been seen at [facility] rheumatologist, pulmonologist, and dermatologist and  medications have been updated. Her previous inhalers have been combined into one inhaler.   Pt overall pulmonary condition has improved and she is more active.   Rheumatologist has added 5 mg flexeril for neck discomfort which she uses mainly at night.   Patient Active Problem List   Diagnosis Date Noted  . Non-alcoholic fatty liver disease 05/11/2018  . Acute on chronic respiratory failure with hypoxia ([place]) 04/19/2016  . Acute bronchitis 04/19/2016  . Tobacco abuse counseling 04/19/2016  . COPD exacerbation ([place]) 04/17/2016  . Lupus ([place]) 01/01/2016  . Hypothyroidism 01/01/2016  . GERD (gastroesophageal reflux disease) 01/01/2016  . COPD (chronic obstructive pulmonary disease) ([place]) 01/01/2016  . Asthma 01/01/2016    Review of Systems     Objective:   Physical Exam  Constitutional: She is oriented to person, place, and time.  Cardiovascular: Normal rate, regular rhythm and normal heart sounds.  Pulmonary/Chest: Effort normal and breath sounds normal.  Neurological: She is alert and oriented to person, place, and time.     BP (!) 141/81   Pulse 74   Temp 97.8 F (36.6 C) (Oral)   Wt 226 lb 9.6 oz (102.8 kg)   BMI 38.90 kg/m    Allergies as of 05/11/2018      Reactions   Methotrexate Derivatives Anaphylaxis   Cefdinir    Chantix [varenicline]    rash   Codeine    SOB, itching   Plaquenil [hydroxychloroquine] Other (See Comments)   Hair loss and rash      Medication List        Accurate as of 05/11/18  9:32 AM. Always use your most recent med list.          ADVAIR DISKUS 500-50 MCG/DOSE Aepb Generic drug:  Fluticasone-Salmeterol INHALE 1 PUFF EVERY 12 HOURS.   Fluticasone-Salmeterol 500-50 MCG/DOSE Aepb Commonly known as:  ADVAIR DISKUS INHALE 1 PUFF  EVERY 12 HOURS.   albuterol 108 (90 Base) MCG/ACT inhaler Commonly known as:  PROVENTIL HFA;VENTOLIN HFA Inhale 1-2 puffs into the lungs every 4 (four) hours as needed for wheezing or shortness of breath.   antiseptic oral rinse 0.05 % Liqd solution Commonly known as:  CPC / CETYLPYRIDINIUM CHLORIDE 0.05% 7 mLs by Mouth Rinse route 2 times daily at 12 noon and 4 pm.   azaTHIOprine 50 MG tablet Commonly known as:  IMURAN Take 50 mg by mouth 2 (two) times daily.   azithromycin 250 MG tablet Commonly known as:  ZITHROMAX Take 1 tablet (250 mg total) by mouth daily.   chlorhexidine 0.12 % solution Commonly known as:  PERIDEX 15 mLs by Mouth Rinse route 2 (two) times daily.   diphenhydrAMINE 25 mg capsule Commonly known as:  BENADRYL Take 2 capsules (50 mg total) by mouth every 6 (six) hours as needed.   docusate sodium 100 MG capsule Commonly known as:  COLACE Take 200 mg by mouth daily.   fluticasone 50 MCG/ACT nasal spray Commonly known as:  FLONASE Place 1 spray into both nostrils daily.   folic acid 1 MG tablet Commonly known as:  FOLVITE Take 1 mg by mouth daily. Reported on 04/27/2016   guaiFENesin 600 MG 12 hr tablet Commonly known as:  MUCINEX Take 2 tablets (1,200 mg total) by  mouth 2 (two) times daily.   levothyroxine 125 MCG tablet Commonly known as:  SYNTHROID, LEVOTHROID TAKE ONE TABLET BY MOUTH EVERY DAY FOR THYROID   mometasone 0.1 % ointment Commonly known as:  ELOCON Apply topically as needed.   montelukast 10 MG tablet Commonly known as:  SINGULAIR Take 10 mg by mouth at bedtime.   nicotine 21 mg/24hr patch Commonly known as:  NICODERM CQ - dosed in mg/24 hours Place 1 patch (21 mg total) onto the skin daily.   omeprazole 20 MG capsule Commonly known as:  PRILOSEC TAKE 2 CAPSULES (40MG ) BY MOUTH EVERY DAY FOR GERD   predniSONE 10 MG (21) Tbpk tablet Commonly known as:  STERAPRED UNI-PAK 21 TAB Take 1 tablet (10 mg total) by mouth daily.  Please take 6 pills/60 mg in the morning on the day of 1, then taper by 1 pill/10 mg every 2 days until finished. Thank you   sodium chloride 0.65 % Soln nasal spray Commonly known as:  OCEAN Place 1 spray into both nostrils as needed for congestion.   sucralfate 1 GM/10ML suspension Commonly known as:  CARAFATE Take 10 mLs (1 g total) by mouth 3 (three) times daily with meals.   triamcinolone cream 0.1 % Commonly known as:  KENALOG Apply 1 application topically as needed.              Assessment & Plan:   Continue meds as prescribed  FU in 6 months with labs 1 week prior

## 2018-05-19 ENCOUNTER — Other Ambulatory Visit: Payer: Self-pay | Admitting: Adult Health Nurse Practitioner

## 2018-06-14 ENCOUNTER — Telehealth: Payer: Self-pay | Admitting: Pharmacist

## 2018-06-14 NOTE — Telephone Encounter (Signed)
06/14/2018 8:58:04 AM - Ventolin HFA refill  06/14/18 Placed refill online with [facility] for Ventolin HFA, to release 06/27/18, order#[id number].[name]

## 2018-06-23 ENCOUNTER — Other Ambulatory Visit: Payer: Self-pay | Admitting: Adult Health Nurse Practitioner

## 2018-07-20 ENCOUNTER — Other Ambulatory Visit: Payer: Self-pay | Admitting: Adult Health Nurse Practitioner

## 2018-08-22 ENCOUNTER — Other Ambulatory Visit: Payer: Self-pay | Admitting: Internal Medicine

## 2018-08-25 ENCOUNTER — Other Ambulatory Visit: Payer: Self-pay | Admitting: Internal Medicine

## 2018-09-01 ENCOUNTER — Ambulatory Visit: Payer: Self-pay | Admitting: Ophthalmology

## 2018-09-07 ENCOUNTER — Telehealth: Payer: Self-pay | Admitting: Pharmacist

## 2018-09-07 NOTE — Telephone Encounter (Signed)
09/07/2018 2:40:37 PM - Ventolin refill  09/07/18 Placed refill online with [facility] for Ventolin HFA, to release 09/21/18, order # [id number].[name]

## 2018-09-08 ENCOUNTER — Ambulatory Visit: Payer: Self-pay | Admitting: Ophthalmology

## 2018-10-10 ENCOUNTER — Other Ambulatory Visit: Payer: Self-pay | Admitting: Internal Medicine

## 2018-10-10 MED ORDER — LEVOTHYROXINE SODIUM 125 MCG PO TABS: 125.0000 ug | ORAL_TABLET | Freq: Every day | ORAL | 1 refills | Status: DC

## 2018-11-01 ENCOUNTER — Telehealth: Payer: Self-pay | Admitting: Pharmacist

## 2018-11-01 NOTE — Telephone Encounter (Signed)
11/01/2018 11:59:41 AM - Ventolin HFA refill  11/01/18 Placed refill online with [facility] for Ventolin HFA, to release 12/05/18, order# [id number].Isabel Jimenez

## 2018-11-02 ENCOUNTER — Other Ambulatory Visit: Payer: Self-pay

## 2018-11-09 ENCOUNTER — Other Ambulatory Visit: Payer: Self-pay

## 2018-11-09 DIAGNOSIS — E0590 Thyrotoxicosis, unspecified without thyrotoxic crisis or storm: Secondary | ICD-10-CM

## 2018-11-10 LAB — TSH: TSH: 0.4 u[IU]/mL — AB (ref 0.450–4.500)

## 2018-11-10 LAB — URINALYSIS
Bilirubin, UA: NEGATIVE
Glucose, UA: NEGATIVE
Ketones, UA: NEGATIVE
Leukocytes, UA: NEGATIVE
Nitrite, UA: NEGATIVE
Protein, UA: NEGATIVE
RBC, UA: NEGATIVE
Specific Gravity, UA: 1.018 (ref 1.005–1.030)
Urobilinogen, Ur: 1 mg/dL (ref 0.2–1.0)
pH, UA: 6 (ref 5.0–7.5)

## 2018-11-10 LAB — CBC
Hematocrit: 39.6 % (ref 34.0–46.6)
Hemoglobin: 13 g/dL (ref 11.1–15.9)
MCH: 30.4 pg (ref 26.6–33.0)
MCHC: 32.8 g/dL (ref 31.5–35.7)
MCV: 93 fL (ref 79–97)
Platelets: 290 10*3/uL (ref 150–450)
RBC: 4.27 x10E6/uL (ref 3.77–5.28)
RDW: 13.9 % (ref 12.3–15.4)
WBC: 2.7 10*3/uL — ABNORMAL LOW (ref 3.4–10.8)

## 2018-11-10 LAB — COMPREHENSIVE METABOLIC PANEL
ALK PHOS: 94 IU/L (ref 39–117)
ALT: 28 IU/L (ref 0–32)
AST: 26 IU/L (ref 0–40)
Albumin/Globulin Ratio: 1.4 (ref 1.2–2.2)
Albumin: 4 g/dL (ref 3.5–5.5)
BILIRUBIN TOTAL: 0.3 mg/dL (ref 0.0–1.2)
BUN/Creatinine Ratio: 14 (ref 9–23)
BUN: 12 mg/dL (ref 6–24)
CO2: 26 mmol/L (ref 20–29)
Calcium: 9.1 mg/dL (ref 8.7–10.2)
Chloride: 104 mmol/L (ref 96–106)
Creatinine, Ser: 0.85 mg/dL (ref 0.57–1.00)
GFR calc Af Amer: 87 mL/min/{1.73_m2} (ref >59)
GFR calc non Af Amer: 75 mL/min/{1.73_m2} (ref >59)
Globulin, Total: 2.9 g/dL (ref 1.5–4.5)
Glucose: 96 mg/dL (ref 65–99)
Potassium: 4.3 mmol/L (ref 3.5–5.2)
Sodium: 146 mmol/L — ABNORMAL HIGH (ref 134–144)
Total Protein: 6.9 g/dL (ref 6.0–8.5)

## 2018-11-10 LAB — LIPID PANEL
CHOL/HDL RATIO: 3.6 ratio (ref 0.0–4.4)
CHOLESTEROL TOTAL: 214 mg/dL — AB (ref 100–199)
HDL: 60 mg/dL (ref >39)
LDL CALC: 136 mg/dL — AB (ref 0–99)
TRIGLYCERIDES: 92 mg/dL (ref 0–149)
VLDL Cholesterol Cal: 18 mg/dL (ref 5–40)

## 2018-11-16 ENCOUNTER — Ambulatory Visit: Payer: Self-pay | Admitting: Internal Medicine

## 2018-11-23 ENCOUNTER — Ambulatory Visit: Payer: Self-pay | Admitting: Internal Medicine

## 2018-11-23 ENCOUNTER — Encounter: Payer: Self-pay | Admitting: Internal Medicine

## 2018-11-23 VITALS — BP 138/86 | HR 73 | Temp 98.2°F | Ht 64.0 in | Wt 227.9 lb

## 2018-11-23 DIAGNOSIS — E039 Hypothyroidism, unspecified: Secondary | ICD-10-CM

## 2018-11-23 NOTE — Progress Notes (Signed)
Subjective:    Patient ID: Isabel Jimenez, female    DOB: [birth year], 59 y.o.   MRN: [id number]  HPI   Patient is a 57 year old female who presents for 6 month follow up. Pt recently had sleep study and needs CPAP machine, she states she is currently re-applying for [facility] in order to get machine. Pt states if she begins using CPAP machine she should no loner have to use oxygen. Pt is following up with dermatology once she renews [facility].  Chief Complaint  Patient presents with  . Follow-up    Thyroid lab review     Review of Systems Patient Active Problem List   Diagnosis Date Noted  . Non-alcoholic fatty liver disease 05/11/2018  . Acute on chronic respiratory failure with hypoxia ([place]) 04/19/2016  . Acute bronchitis 04/19/2016  . Tobacco abuse counseling 04/19/2016  . COPD exacerbation ([place]) 04/17/2016  . Lupus ([place]) 01/01/2016  . Hypothyroidism 01/01/2016  . GERD (gastroesophageal reflux disease) 01/01/2016  . COPD (chronic obstructive pulmonary disease) ([place]) 01/01/2016  . Asthma 01/01/2016   Allergies as of 11/23/2018      Reactions   Methotrexate Derivatives Anaphylaxis   Cefdinir    Chantix [varenicline]    rash   Codeine    SOB, itching   Plaquenil [hydroxychloroquine] Other (See Comments)   Hair loss and rash      Medication List       Accurate as of November 23, 2018  9:54 AM. Always use your most recent med list.        ADVAIR DISKUS 500-50 MCG/DOSE Aepb Generic drug:  Fluticasone-Salmeterol INHALE 1 PUFF EVERY 12 HOURS.   Fluticasone-Salmeterol 500-50 MCG/DOSE Aepb Commonly known as:  ADVAIR DISKUS INHALE 1 PUFF EVERY 12 HOURS.   albuterol 108 (90 Base) MCG/ACT inhaler Commonly known as:  PROVENTIL HFA;VENTOLIN HFA Inhale 1-2 puffs into the lungs every 4 (four) hours as needed for wheezing or shortness of breath.   antiseptic oral rinse 0.05 % Liqd solution Commonly known as:  CPC / CETYLPYRIDINIUM CHLORIDE 0.05% 7 mLs by Mouth  Rinse route 2 times daily at 12 noon and 4 pm.   azaTHIOprine 50 MG tablet Commonly known as:  IMURAN Take 50 mg by mouth 2 (two) times daily.   azithromycin 250 MG tablet Commonly known as:  ZITHROMAX Take 1 tablet (250 mg total) by mouth daily.   chlorhexidine 0.12 % solution Commonly known as:  PERIDEX 15 mLs by Mouth Rinse route 2 (two) times daily.   cyclobenzaprine 5 MG tablet Commonly known as:  FLEXERIL Take 5 mg by mouth 2 (two) times daily as needed for muscle spasms.   diphenhydrAMINE 25 mg capsule Commonly known as:  BENADRYL Take 2 capsules (50 mg total) by mouth every 6 (six) hours as needed.   docusate sodium 100 MG capsule Commonly known as:  COLACE Take 200 mg by mouth daily.   fluticasone 50 MCG/ACT nasal spray Commonly known as:  FLONASE PLACE 1 SPRAY INTO BOTH NOSTRIL DAILY   folic acid 1 MG tablet Commonly known as:  FOLVITE Take 1 mg by mouth daily. Reported on 04/27/2016   guaiFENesin 600 MG 12 hr tablet Commonly known as:  MUCINEX Take 2 tablets (1,200 mg total) by mouth 2 (two) times daily.   levothyroxine 125 MCG tablet Commonly known as:  SYNTHROID, LEVOTHROID Take 1 tablet (125 mcg total) by mouth daily before breakfast.   mometasone 0.1 % ointment Commonly known as:  ELOCON  Apply topically as needed.   montelukast 10 MG tablet Commonly known as:  SINGULAIR Take 10 mg by mouth at bedtime.   nicotine 21 mg/24hr patch Commonly known as:  NICODERM CQ - dosed in mg/24 hours Place 1 patch (21 mg total) onto the skin daily.   omeprazole 20 MG capsule Commonly known as:  PRILOSEC TAKE 2 CAPSULES (40MG ) BY MOUTH EVERY DAY FOR GERD   predniSONE 10 MG (21) Tbpk tablet Commonly known as:  STERAPRED UNI-PAK 21 TAB Take 1 tablet (10 mg total) by mouth daily. Please take 6 pills/60 mg in the morning on the day of 1, then taper by 1 pill/10 mg every 2 days until finished. Thank you   sodium chloride 0.65 % Soln nasal spray Commonly known as:   OCEAN Place 1 spray into both nostrils as needed for congestion.   sucralfate 1 GM/10ML suspension Commonly known as:  CARAFATE Take 10 mLs (1 g total) by mouth 3 (three) times daily with meals.   TRELEGY ELLIPTA 100-62.5-25 MCG/INH Aepb Generic drug:  Fluticasone-Umeclidin-Vilant Inhale into the lungs once.   triamcinolone cream 0.1 % Commonly known as:  KENALOG Apply 1 application topically as needed.          Objective:   Physical Exam Constitutional:      Appearance: Normal appearance.  Cardiovascular:     Rate and Rhythm: Normal rate and regular rhythm.     Heart sounds: Normal heart sounds.  Neurological:     Mental Status: She is alert and oriented to person, place, and time.  Psychiatric:        Behavior: Behavior normal.     BP 138/86   Pulse 73   Temp 98.2 F (36.8 C) (Oral)   Ht 5\' 4"  (1.626 m)   Wt 227 lb 14.4 oz (103.4 kg)   BMI 39.12 kg/m        Assessment & Plan:   1. Hypothyroidism, unspecified type Pt's TSH improved to 0.400 from 0.841 (04/27/18). Do not recommend any medication change at this time.  Check in 6 Months: - T4 AND TSH; Future - Comprehensive metabolic panel; Future - CBC; Future - Urinalysis; Future - Lipid panel; Future  Follow up in 6 months with labs a week prior.    Return in 6 months

## 2018-12-09 ENCOUNTER — Other Ambulatory Visit: Payer: Self-pay | Admitting: Internal Medicine

## 2019-02-07 ENCOUNTER — Other Ambulatory Visit: Payer: Self-pay | Admitting: Internal Medicine

## 2019-03-20 ENCOUNTER — Other Ambulatory Visit: Payer: Self-pay | Admitting: Internal Medicine

## 2019-05-11 ENCOUNTER — Telehealth: Payer: Self-pay | Admitting: Pharmacy Technician

## 2019-05-11 ENCOUNTER — Other Ambulatory Visit: Payer: Self-pay | Admitting: Internal Medicine

## 2019-05-11 NOTE — Telephone Encounter (Signed)
Received 2020 proof of income.  Patient eligible to receive medication assistance at [facility] as long as eligibility requirements continue to be met.  [facility]

## 2019-05-24 ENCOUNTER — Other Ambulatory Visit: Payer: Self-pay

## 2019-05-24 DIAGNOSIS — E039 Hypothyroidism, unspecified: Secondary | ICD-10-CM

## 2019-05-25 LAB — CBC
Hematocrit: 41 % (ref 34.0–46.6)
Hemoglobin: 13.3 g/dL (ref 11.1–15.9)
MCH: 29.4 pg (ref 26.6–33.0)
MCHC: 32.4 g/dL (ref 31.5–35.7)
MCV: 91 fL (ref 79–97)
Platelets: 376 10*3/uL (ref 150–450)
RBC: 4.52 x10E6/uL (ref 3.77–5.28)
RDW: 14.5 % (ref 11.7–15.4)
WBC: 5.9 10*3/uL (ref 3.4–10.8)

## 2019-05-25 LAB — URINALYSIS
Bilirubin, UA: NEGATIVE
Glucose, UA: NEGATIVE
Ketones, UA: NEGATIVE
Leukocytes,UA: NEGATIVE
Nitrite, UA: NEGATIVE
Protein,UA: NEGATIVE
RBC, UA: NEGATIVE
Specific Gravity, UA: <=1.005 — AB (ref 1.005–1.030)
Urobilinogen, Ur: 0.2 mg/dL (ref 0.2–1.0)
pH, UA: 7 (ref 5.0–7.5)

## 2019-05-25 LAB — COMPREHENSIVE METABOLIC PANEL
ALT: 18 IU/L (ref 0–32)
AST: 14 IU/L (ref 0–40)
Albumin/Globulin Ratio: 1.3 (ref 1.2–2.2)
Albumin: 4 g/dL (ref 3.8–4.9)
Alkaline Phosphatase: 111 IU/L (ref 39–117)
BUN/Creatinine Ratio: 25 (ref 12–28)
BUN: 22 mg/dL (ref 8–27)
Bilirubin Total: 0.3 mg/dL (ref 0.0–1.2)
CO2: 25 mmol/L (ref 20–29)
Calcium: 9.1 mg/dL (ref 8.7–10.3)
Chloride: 100 mmol/L (ref 96–106)
Creatinine, Ser: 0.89 mg/dL (ref 0.57–1.00)
GFR calc Af Amer: 81 mL/min/{1.73_m2} (ref >59)
GFR calc non Af Amer: 71 mL/min/{1.73_m2} (ref >59)
Globulin, Total: 3.1 g/dL (ref 1.5–4.5)
Glucose: 86 mg/dL (ref 65–99)
Potassium: 4.2 mmol/L (ref 3.5–5.2)
Sodium: 140 mmol/L (ref 134–144)
Total Protein: 7.1 g/dL (ref 6.0–8.5)

## 2019-05-25 LAB — LIPID PANEL
Chol/HDL Ratio: 3.2 ratio (ref 0.0–4.4)
Cholesterol, Total: 231 mg/dL — ABNORMAL HIGH (ref 100–199)
HDL: 73 mg/dL (ref >39)
LDL Calculated: 128 mg/dL — ABNORMAL HIGH (ref 0–99)
Triglycerides: 152 mg/dL — ABNORMAL HIGH (ref 0–149)
VLDL Cholesterol Cal: 30 mg/dL (ref 5–40)

## 2019-05-25 LAB — T4 AND TSH
T4, Total: 9.3 ug/dL (ref 4.5–12.0)
TSH: 3.71 u[IU]/mL (ref 0.450–4.500)

## 2019-05-31 ENCOUNTER — Ambulatory Visit: Payer: Self-pay | Admitting: Internal Medicine

## 2019-06-07 ENCOUNTER — Encounter: Payer: Self-pay | Admitting: Internal Medicine

## 2019-06-07 ENCOUNTER — Ambulatory Visit: Payer: Self-pay | Admitting: Internal Medicine

## 2019-06-07 ENCOUNTER — Other Ambulatory Visit: Payer: Self-pay

## 2019-06-07 DIAGNOSIS — E039 Hypothyroidism, unspecified: Secondary | ICD-10-CM

## 2019-06-07 DIAGNOSIS — K760 Fatty (change of) liver, not elsewhere classified: Secondary | ICD-10-CM

## 2019-06-07 NOTE — Progress Notes (Signed)
Telephonic visit  Subjective:    Patient ID: Isabel Jimenez, female    DOB: 1961/07/25, 58 y.o.   MRN: [id number]  HPI  Patient states she is doing well. Thyroid labs look good. Medication seems to be well balanced. Pt reports that thyroid was checked in [facility] and there were "out of wack." Patient states levels were low. Discussed cutting back to one pill a day with PCP at [facility]. Patient has been skipping one dose a week. Patient reports feeling better. Good cholesterol has risen to 73 from 60 on last labs. Patient reports she has gained 10lbs.   Review of Systems Patient Active Problem List   Diagnosis Date Noted  . Non-alcoholic fatty liver disease 05/11/2018  . Acute on chronic respiratory failure with hypoxia ([place]) 04/19/2016  . Acute bronchitis 04/19/2016  . Tobacco abuse counseling 04/19/2016  . COPD exacerbation ([place]) 04/17/2016  . Lupus ([place]) 01/01/2016  . Hypothyroidism 01/01/2016  . GERD (gastroesophageal reflux disease) 01/01/2016  . COPD (chronic obstructive pulmonary disease) ([place]) 01/01/2016  . Asthma 01/01/2016   Allergies as of 06/07/2019      Reactions   Methotrexate Derivatives Anaphylaxis   Cefdinir    Chantix [varenicline]    rash   Codeine    SOB, itching   Plaquenil [hydroxychloroquine] Other (See Comments)   Hair loss and rash      Medication List       Accurate as of June 07, 2019  9:51 AM. If you have any questions, ask your nurse or doctor.        Advair Diskus 500-50 MCG/DOSE Aepb Generic drug: Fluticasone-Salmeterol INHALE 1 PUFF EVERY 12 HOURS.   Fluticasone-Salmeterol 500-50 MCG/DOSE Aepb Commonly known as: Advair Diskus INHALE 1 PUFF EVERY 12 HOURS.   albuterol 108 (90 Base) MCG/ACT inhaler Commonly known as: VENTOLIN HFA Inhale 1-2 puffs into the lungs every 4 (four) hours as needed for wheezing or shortness of breath.   antiseptic oral rinse 0.05 % Liqd solution Commonly known as: CPC / CETYLPYRIDINIUM CHLORIDE 0.05% 7  mLs by Mouth Rinse route 2 times daily at 12 noon and 4 pm.   azaTHIOprine 50 MG tablet Commonly known as: IMURAN Take 50 mg by mouth 2 (two) times daily.   azithromycin 250 MG tablet Commonly known as: Zithromax Take 1 tablet (250 mg total) by mouth daily.   chlorhexidine 0.12 % solution Commonly known as: PERIDEX 15 mLs by Mouth Rinse route 2 (two) times daily.   cyclobenzaprine 5 MG tablet Commonly known as: FLEXERIL Take 5 mg by mouth 2 (two) times daily as needed for muscle spasms.   diphenhydrAMINE 25 mg capsule Commonly known as: BENADRYL Take 2 capsules (50 mg total) by mouth every 6 (six) hours as needed.   docusate sodium 100 MG capsule Commonly known as: COLACE Take 200 mg by mouth daily.   fluticasone 50 MCG/ACT nasal spray Commonly known as: FLONASE PLACE 1 SPRAY INTO BOTH NOSTRIL DAILY   folic acid 1 MG tablet Commonly known as: FOLVITE Take 1 mg by mouth daily. Reported on 04/27/2016   guaiFENesin 600 MG 12 hr tablet Commonly known as: MUCINEX Take 2 tablets (1,200 mg total) by mouth 2 (two) times daily.   levothyroxine 125 MCG tablet Commonly known as: SYNTHROID TAKE ONE TABLET BY MOUTH EVERY DAY BEFORE BREAKFAST.   mometasone 0.1 % ointment Commonly known as: ELOCON Apply topically as needed.   montelukast 10 MG tablet Commonly known as: SINGULAIR Take 10 mg by mouth  at bedtime.   nicotine 21 mg/24hr patch Commonly known as: NICODERM CQ - dosed in mg/24 hours Place 1 patch (21 mg total) onto the skin daily.   omeprazole 20 MG capsule Commonly known as: PRILOSEC TAKE 2 CAPSULES (40MG ) BY MOUTH EVERY DAY   predniSONE 10 MG (21) Tbpk tablet Commonly known as: STERAPRED UNI-PAK 21 TAB Take 1 tablet (10 mg total) by mouth daily. Please take 6 pills/60 mg in the morning on the day of 1, then taper by 1 pill/10 mg every 2 days until finished. Thank you   sodium chloride 0.65 % Soln nasal spray Commonly known as: OCEAN Place 1 spray into both  nostrils as needed for congestion.   sucralfate 1 GM/10ML suspension Commonly known as: CARAFATE Take 10 mLs (1 g total) by mouth 3 (three) times daily with meals.   Trelegy Ellipta 100-62.5-25 MCG/INH Aepb Generic drug: Fluticasone-Umeclidin-Vilant Inhale into the lungs once.   triamcinolone cream 0.1 % Commonly known as: KENALOG Apply 1 application topically as needed.          Objective:   Physical Exam   none      Assessment & Plan:   Pt progress is well, no change necessary.

## 2019-10-02 ENCOUNTER — Other Ambulatory Visit: Payer: Self-pay | Admitting: Internal Medicine

## 2019-11-20 ENCOUNTER — Telehealth: Payer: Self-pay | Admitting: Pharmacist

## 2019-11-20 NOTE — Telephone Encounter (Signed)
11/20/2019 9:23:45 AM - Ventolin script faxed to [place] for refill  11/20/2019 [facility] signed script for Dr. Iva Lento script signed by Benjamine Mola for Ventolin HFA 49mcg Inject 2 puffs for times a day to [facility] for refill.Isabel Jimenez

## 2019-12-06 ENCOUNTER — Other Ambulatory Visit: Payer: Self-pay

## 2019-12-06 DIAGNOSIS — E039 Hypothyroidism, unspecified: Secondary | ICD-10-CM

## 2019-12-07 LAB — CBC WITH DIFFERENTIAL/PLATELET
Basophils Absolute: 0 10*3/uL (ref 0.0–0.2)
Basos: 1 %
EOS (ABSOLUTE): 0.1 10*3/uL (ref 0.0–0.4)
Eos: 3 %
Hematocrit: 41.7 % (ref 34.0–46.6)
Hemoglobin: 13.7 g/dL (ref 11.1–15.9)
Immature Grans (Abs): 0 10*3/uL (ref 0.0–0.1)
Immature Granulocytes: 1 %
Lymphocytes Absolute: 1.1 10*3/uL (ref 0.7–3.1)
Lymphs: 24 %
MCH: 30.2 pg (ref 26.6–33.0)
MCHC: 32.9 g/dL (ref 31.5–35.7)
MCV: 92 fL (ref 79–97)
Monocytes Absolute: 0.5 10*3/uL (ref 0.1–0.9)
Monocytes: 12 %
Neutrophils Absolute: 2.6 10*3/uL (ref 1.4–7.0)
Neutrophils: 59 %
Platelets: 287 10*3/uL (ref 150–450)
RBC: 4.53 x10E6/uL (ref 3.77–5.28)
RDW: 14.6 % (ref 11.7–15.4)
WBC: 4.4 10*3/uL (ref 3.4–10.8)

## 2019-12-07 LAB — COMPREHENSIVE METABOLIC PANEL
ALT: 37 IU/L — ABNORMAL HIGH (ref 0–32)
AST: 28 IU/L (ref 0–40)
Albumin/Globulin Ratio: 1.5 (ref 1.2–2.2)
Albumin: 4.1 g/dL (ref 3.8–4.9)
Alkaline Phosphatase: 113 IU/L (ref 39–117)
BUN/Creatinine Ratio: 15 (ref 12–28)
BUN: 12 mg/dL (ref 8–27)
Bilirubin Total: 0.2 mg/dL (ref 0.0–1.2)
CO2: 28 mmol/L (ref 20–29)
Calcium: 8.9 mg/dL (ref 8.7–10.3)
Chloride: 102 mmol/L (ref 96–106)
Creatinine, Ser: 0.79 mg/dL (ref 0.57–1.00)
GFR calc Af Amer: 94 mL/min/{1.73_m2} (ref >59)
GFR calc non Af Amer: 82 mL/min/{1.73_m2} (ref >59)
Globulin, Total: 2.8 g/dL (ref 1.5–4.5)
Glucose: 99 mg/dL (ref 65–99)
Potassium: 4 mmol/L (ref 3.5–5.2)
Sodium: 142 mmol/L (ref 134–144)
Total Protein: 6.9 g/dL (ref 6.0–8.5)

## 2019-12-07 LAB — URINALYSIS, ROUTINE W REFLEX MICROSCOPIC
Bilirubin, UA: NEGATIVE
Glucose, UA: NEGATIVE
Ketones, UA: NEGATIVE
Leukocytes,UA: NEGATIVE
Nitrite, UA: NEGATIVE
Protein,UA: NEGATIVE
RBC, UA: NEGATIVE
Specific Gravity, UA: 1.009 (ref 1.005–1.030)
Urobilinogen, Ur: 0.2 mg/dL (ref 0.2–1.0)
pH, UA: 7.5 (ref 5.0–7.5)

## 2019-12-07 LAB — LIPID PANEL
Chol/HDL Ratio: 3.2 ratio (ref 0.0–4.4)
Cholesterol, Total: 201 mg/dL — ABNORMAL HIGH (ref 100–199)
HDL: 63 mg/dL (ref >39)
LDL Chol Calc (NIH): 110 mg/dL — ABNORMAL HIGH (ref 0–99)
Triglycerides: 160 mg/dL — ABNORMAL HIGH (ref 0–149)
VLDL Cholesterol Cal: 28 mg/dL (ref 5–40)

## 2019-12-07 LAB — T4: T4, Total: 9.1 ug/dL (ref 4.5–12.0)

## 2019-12-07 LAB — TSH: TSH: 4.51 u[IU]/mL — ABNORMAL HIGH (ref 0.450–4.500)

## 2019-12-13 ENCOUNTER — Encounter: Payer: Self-pay | Admitting: Internal Medicine

## 2019-12-13 ENCOUNTER — Ambulatory Visit: Payer: Self-pay | Admitting: Internal Medicine

## 2019-12-13 ENCOUNTER — Other Ambulatory Visit: Payer: Self-pay

## 2019-12-13 VITALS — BP 170/85 | HR 66 | Temp 97.6°F | Ht 64.0 in | Wt 255.3 lb

## 2019-12-13 DIAGNOSIS — J449 Chronic obstructive pulmonary disease, unspecified: Secondary | ICD-10-CM

## 2019-12-13 DIAGNOSIS — M329 Systemic lupus erythematosus, unspecified: Secondary | ICD-10-CM

## 2019-12-13 DIAGNOSIS — E039 Hypothyroidism, unspecified: Secondary | ICD-10-CM

## 2019-12-13 NOTE — Progress Notes (Signed)
Subjective:    Patient ID: Isabel Jimenez, female    DOB: 11/18/1961, 59 y.o.   MRN: [id number]  HPI  Patient is a 59 year old female presenting for a telephonic visit. Pt reports she is doing well.  Review of Systems Patient Active Problem List   Diagnosis Date Noted  . Non-alcoholic fatty liver disease 05/11/2018  . Acute on chronic respiratory failure with hypoxia ([place]) 04/19/2016  . Acute bronchitis 04/19/2016  . Tobacco abuse counseling 04/19/2016  . COPD exacerbation ([place]) 04/17/2016  . Lupus ([place]) 01/01/2016  . Hypothyroidism 01/01/2016  . GERD (gastroesophageal reflux disease) 01/01/2016  . COPD (chronic obstructive pulmonary disease) ([place]) 01/01/2016  . Asthma 01/01/2016   Allergies as of 12/13/2019      Reactions   Methotrexate Derivatives Anaphylaxis   Cefdinir    Chantix [varenicline]    rash   Codeine    SOB, itching   Plaquenil [hydroxychloroquine] Other (See Comments)   Hair loss and rash      Medication List       Accurate as of December 13, 2019  9:27 AM. If you have any questions, ask your nurse or doctor.        Advair Diskus 500-50 MCG/DOSE Aepb Generic drug: Fluticasone-Salmeterol INHALE 1 PUFF EVERY 12 HOURS.   Fluticasone-Salmeterol 500-50 MCG/DOSE Aepb Commonly known as: Advair Diskus INHALE 1 PUFF EVERY 12 HOURS.   albuterol 108 (90 Base) MCG/ACT inhaler Commonly known as: VENTOLIN HFA Inhale 1-2 puffs into the lungs every 4 (four) hours as needed for wheezing or shortness of breath.   antiseptic oral rinse 0.05 % Liqd solution Commonly known as: CPC / CETYLPYRIDINIUM CHLORIDE 0.05% 7 mLs by Mouth Rinse route 2 times daily at 12 noon and 4 pm.   azaTHIOprine 50 MG tablet Commonly known as: IMURAN Take 50 mg by mouth 2 (two) times daily.   chlorhexidine 0.12 % solution Commonly known as: PERIDEX 15 mLs by Mouth Rinse route 2 (two) times daily.   cyclobenzaprine 5 MG tablet Commonly known as: FLEXERIL Take 5 mg by mouth 2 (two)  times daily as needed for muscle spasms.   fluticasone 50 MCG/ACT nasal spray Commonly known as: FLONASE PLACE 1 SPRAY INTO BOTH NOSTRIL DAILY   folic acid 1 MG tablet Commonly known as: FOLVITE Take 1 mg by mouth daily. Reported on 04/27/2016   guaiFENesin 600 MG 12 hr tablet Commonly known as: MUCINEX Take 2 tablets (1,200 mg total) by mouth 2 (two) times daily.   levothyroxine 125 MCG tablet Commonly known as: SYNTHROID TAKE ONE TABLET BY MOUTH EVERY DAY BEFORE BREAKFAST.   mometasone 0.1 % ointment Commonly known as: ELOCON Apply topically as needed.   montelukast 10 MG tablet Commonly known as: SINGULAIR Take 10 mg by mouth at bedtime.   nicotine 21 mg/24hr patch Commonly known as: NICODERM CQ - dosed in mg/24 hours Place 1 patch (21 mg total) onto the skin daily.   omeprazole 20 MG capsule Commonly known as: PRILOSEC TAKE 2 CAPSULES (40MG) BY MOUTH EVERY DAY   predniSONE 10 MG (21) Tbpk tablet Commonly known as: STERAPRED UNI-PAK 21 TAB Take 1 tablet (10 mg total) by mouth daily. Please take 6 pills/60 mg in the morning on the day of 1, then taper by 1 pill/10 mg every 2 days until finished. Thank you   sodium chloride 0.65 % Soln nasal spray Commonly known as: OCEAN Place 1 spray into both nostrils as needed for congestion.   sucralfate 1  GM/10ML suspension Commonly known as: CARAFATE Take 10 mLs (1 g total) by mouth 3 (three) times daily with meals.   Trelegy Ellipta 100-62.5-25 MCG/INH Aepb Generic drug: Fluticasone-Umeclidin-Vilant Inhale into the lungs once.   triamcinolone cream 0.1 % Commonly known as: KENALOG Apply 1 application topically as needed.          Objective:   Physical Exam  No PE - telephonic visit      Assessment & Plan:  1. Hypothyroidism, unspecified type Laboratory studies appear therapeutic. Pt asymptomatic. No change in medication. - T4; Future - TSH; Future - Comp Met (CMET); Future - CBC; Future - UA/M w/rflx  Culture, Routine; Future - HgB A1c; Future  2. Chronic obstructive pulmonary disease, unspecified COPD type ([place]) COPD followed by [facility]. Appears to be stable.  3. Lupus ([place]) Followed by rheumatology at [facility]. Appears to be stable.    FU in 6 months with labs a week prior.

## 2020-01-15 ENCOUNTER — Telehealth: Payer: Self-pay | Admitting: Pharmacist

## 2020-01-15 ENCOUNTER — Other Ambulatory Visit: Payer: Self-pay | Admitting: Internal Medicine

## 2020-01-15 NOTE — Telephone Encounter (Signed)
01/15/2020 11:51:58 AM - [place] renewal application for Ventolin  01/15/2020 Mailing patient her portion of [place] application to sign for renewal, also sending Ventolin script to [facility] for [place] to sign.[name]

## 2020-01-24 ENCOUNTER — Other Ambulatory Visit: Payer: Self-pay | Admitting: Internal Medicine

## 2020-03-25 ENCOUNTER — Telehealth: Payer: Self-pay | Admitting: Pharmacy Technician

## 2020-03-25 NOTE — Telephone Encounter (Signed)
Received updated proof of income.  Patient eligible to receive medication assistance at [facility] until time for re-certification in 9359, and as long as eligibility requirements continue to be met.  [place] [facility]

## 2020-05-14 ENCOUNTER — Encounter: Payer: Self-pay | Admitting: Pharmacist

## 2020-05-14 ENCOUNTER — Other Ambulatory Visit: Payer: Self-pay

## 2020-05-14 ENCOUNTER — Ambulatory Visit: Payer: Self-pay | Admitting: Pharmacist

## 2020-05-14 NOTE — Progress Notes (Unsigned)
[facility] Visit Note  Patient: Isabel Jimenez MRN: [id number] Date of Birth: 1961/05/17 PCP: Tawni Millers, MD   Isabel Jimenez 59 y.o. female presents for a *** visit today.  There were no vitals taken for this visit.  Patient Information   Past Medical History:  Diagnosis Date  . Asthma   . Cancer ([place])    skin  . COPD (chronic obstructive pulmonary disease) ([place])   . Joint pain   . Lupus ([place])   . Migraines   . Thyroid disease       Past Surgical History:  Procedure Laterality Date  . SQUAMOUS CELL CARCINOMA EXCISION     Left Shoulder  . TUBAL LIGATION       Family History  Problem Relation Age of Onset  . Cancer Mother        Breast  . Cancer Father        Colon  . Renal Disease Father     New Diagnoses (since last visit):   Family Support: {Family Support:210800003}  Lifestyle Diet: Breakfast:*** Lunch:*** Dinner:*** Drinks:***            Social History   Substance and Sexual Activity  Alcohol Use No  . Alcohol/week: 0.0 standard drinks      Social History   Tobacco Use  Smoking Status Former Smoker  . Packs/day: 0.50  . Years: 45.00  . Pack years: 22.50  . Types: Cigarettes  Smokeless Tobacco Never Used  Tobacco Comment   last cig 04/26/16      Health Maintenance  Topic Date Due  . HIV Screening  Never done  . PAP SMEAR-Modifier  Never done  . MAMMOGRAM  Never done  . COLONOSCOPY  Never done  . INFLUENZA VACCINE  07/07/2020  . TETANUS/TDAP  05/14/2025  . COVID-19 Vaccine  Completed  . Hepatitis C Screening  Completed   Health Maintenance/Date Completed  Last ED visit: *** Last Visit to PCP: *** Next Visit to PCP: *** Specialist Visit: *** Dental Exam: *** Eye Exam: *** Prostate Exam: *** Pelvic/PAP Exam: *** Mammogram: *** DEXA: *** Colonoscopy: *** Flu Vaccine: *** Pneumonia Vaccine: *** COVID-19 Vaccine: *** Shingrix Vaccine: ***     Outpatient Encounter Medications as of  05/14/2020  Medication Sig  . albuterol (PROVENTIL HFA;VENTOLIN HFA) 108 (90 Base) MCG/ACT inhaler Inhale 1-2 puffs into the lungs every 4 (four) hours as needed for wheezing or shortness of breath.  Marland Kitchen albuterol (PROVENTIL) (2.5 MG/3ML) 0.083% nebulizer solution Take 2.5 mg by nebulization every 6 (six) hours as needed for wheezing or shortness of breath.  Marland Kitchen amLODipine (NORVASC) 5 MG tablet Take 5 mg by mouth daily.  Marland Kitchen azaTHIOprine (IMURAN) 50 MG tablet Take 50 mg by mouth 2 (two) times daily.  . cyclobenzaprine (FLEXERIL) 5 MG tablet Take 5 mg by mouth 2 (two) times daily as needed for muscle spasms.  . fluticasone (FLONASE) 50 MCG/ACT nasal spray PLACE 1 SPRAY INTO BOTH NOSTRIL DAILY  . Fluticasone-Umeclidin-Vilant (TRELEGY ELLIPTA) 100-62.5-25 MCG/INH AEPB Inhale into the lungs once.   . folic acid (FOLVITE) 1 MG tablet Take 1 mg by mouth daily. Reported on 04/27/2016  . gabapentin (NEURONTIN) 300 MG capsule Take 300 mg by mouth 2 (two) times daily.  Marland Kitchen ibuprofen (ADVIL) 200 MG tablet Take 200 mg by mouth every 6 (six) hours as needed.  Marland Kitchen levothyroxine (SYNTHROID) 137 MCG tablet Take 137 mcg by mouth daily before breakfast.  . metFORMIN (GLUCOPHAGE-XR) 500 MG 24 hr tablet  Take 1,000 mg by mouth 2 (two) times daily.  . mometasone (ELOCON) 0.1 % ointment Apply topically as needed.  . montelukast (SINGULAIR) 10 MG tablet Take 10 mg by mouth at bedtime.  Marland Kitchen omeprazole (PRILOSEC) 40 MG capsule Take 40 mg by mouth daily.  Marland Kitchen triamcinolone cream (KENALOG) 0.1 % Apply 1 application topically as needed.  . [DISCONTINUED] ADVAIR DISKUS 500-50 MCG/DOSE AEPB INHALE 1 PUFF EVERY 12 HOURS.  . [DISCONTINUED] antiseptic oral rinse (CPC / CETYLPYRIDINIUM CHLORIDE 0.05%) 0.05 % LIQD solution 7 mLs by Mouth Rinse route 2 times daily at 12 noon and 4 pm.  . [DISCONTINUED] chlorhexidine (PERIDEX) 0.12 % solution 15 mLs by Mouth Rinse route 2 (two) times daily.  . [DISCONTINUED] Fluticasone-Salmeterol (ADVAIR DISKUS)  500-50 MCG/DOSE AEPB INHALE 1 PUFF EVERY 12 HOURS.  . [DISCONTINUED] guaiFENesin (MUCINEX) 600 MG 12 hr tablet Take 2 tablets (1,200 mg total) by mouth 2 (two) times daily. (Patient not taking: Reported on 11/23/2018)  . [DISCONTINUED] levothyroxine (SYNTHROID) 125 MCG tablet TAKE ONE TABLET BY MOUTH EVERY DAY BEFORE BREAKFAST.  . [DISCONTINUED] nicotine (NICODERM CQ - DOSED IN MG/24 HOURS) 21 mg/24hr patch Place 1 patch (21 mg total) onto the skin daily. (Patient not taking: Reported on 10/28/2016)  . [DISCONTINUED] omeprazole (PRILOSEC) 20 MG capsule TAKE 2 CAPSULES (40MG ) BY MOUTH EVERY DAY  . [DISCONTINUED] predniSONE (STERAPRED UNI-PAK 21 TAB) 10 MG (21) TBPK tablet Take 1 tablet (10 mg total) by mouth daily. Please take 6 pills/60 mg in the morning on the day of 1, then taper by 1 pill/10 mg every 2 days until finished. Thank you (Patient not taking: Reported on 10/28/2016)  . [DISCONTINUED] sodium chloride (OCEAN) 0.65 % SOLN nasal spray Place 1 spray into both nostrils as needed for congestion.  . [DISCONTINUED] sucralfate (CARAFATE) 1 GM/10ML suspension Take 10 mLs (1 g total) by mouth 3 (three) times daily with meals. (Patient not taking: Reported on 11/03/2017)   No facility-administered encounter medications on file as of 05/14/2020.     Assessment and Plan:

## 2020-06-12 ENCOUNTER — Other Ambulatory Visit: Payer: Self-pay

## 2020-06-12 DIAGNOSIS — E039 Hypothyroidism, unspecified: Secondary | ICD-10-CM

## 2020-06-13 LAB — COMPREHENSIVE METABOLIC PANEL
ALT: 33 IU/L — ABNORMAL HIGH (ref 0–32)
AST: 24 IU/L (ref 0–40)
Albumin/Globulin Ratio: 1.4 (ref 1.2–2.2)
Albumin: 4.2 g/dL (ref 3.8–4.8)
Alkaline Phosphatase: 105 IU/L (ref 48–121)
BUN/Creatinine Ratio: 14 (ref 12–28)
BUN: 12 mg/dL (ref 8–27)
Bilirubin Total: 0.3 mg/dL (ref 0.0–1.2)
CO2: 24 mmol/L (ref 20–29)
Calcium: 9.2 mg/dL (ref 8.7–10.3)
Chloride: 102 mmol/L (ref 96–106)
Creatinine, Ser: 0.83 mg/dL (ref 0.57–1.00)
GFR calc Af Amer: 88 mL/min/{1.73_m2} (ref >59)
GFR calc non Af Amer: 76 mL/min/{1.73_m2} (ref >59)
Globulin, Total: 2.9 g/dL (ref 1.5–4.5)
Glucose: 98 mg/dL (ref 65–99)
Potassium: 4.3 mmol/L (ref 3.5–5.2)
Sodium: 141 mmol/L (ref 134–144)
Total Protein: 7.1 g/dL (ref 6.0–8.5)

## 2020-06-13 LAB — UA/M W/RFLX CULTURE, ROUTINE
Bilirubin, UA: NEGATIVE
Glucose, UA: NEGATIVE
Ketones, UA: NEGATIVE
Leukocytes,UA: NEGATIVE
Nitrite, UA: NEGATIVE
Protein,UA: NEGATIVE
RBC, UA: NEGATIVE
Specific Gravity, UA: 1.006 (ref 1.005–1.030)
Urobilinogen, Ur: 0.2 mg/dL (ref 0.2–1.0)
pH, UA: 6.5 (ref 5.0–7.5)

## 2020-06-13 LAB — CBC
Hematocrit: 40.7 % (ref 34.0–46.6)
Hemoglobin: 13.2 g/dL (ref 11.1–15.9)
MCH: 30 pg (ref 26.6–33.0)
MCHC: 32.4 g/dL (ref 31.5–35.7)
MCV: 93 fL (ref 79–97)
Platelets: 312 10*3/uL (ref 150–450)
RBC: 4.4 x10E6/uL (ref 3.77–5.28)
RDW: 14.5 % (ref 11.7–15.4)
WBC: 4.6 10*3/uL (ref 3.4–10.8)

## 2020-06-13 LAB — MICROSCOPIC EXAMINATION
Bacteria, UA: NONE SEEN
Casts: NONE SEEN /lpf
Epithelial Cells (non renal): NONE SEEN /hpf (ref 0–10)
RBC, Urine: NONE SEEN /hpf (ref 0–2)
WBC, UA: NONE SEEN /hpf (ref 0–5)

## 2020-06-13 LAB — T4: T4, Total: 11.9 ug/dL (ref 4.5–12.0)

## 2020-06-13 LAB — TSH: TSH: 0.554 u[IU]/mL (ref 0.450–4.500)

## 2020-06-13 LAB — HEMOGLOBIN A1C
Est. average glucose Bld gHb Est-mCnc: 114 mg/dL
Hgb A1c MFr Bld: 5.6 % (ref 4.8–5.6)

## 2020-06-19 ENCOUNTER — Ambulatory Visit: Payer: Self-pay | Admitting: Internal Medicine

## 2020-06-19 ENCOUNTER — Other Ambulatory Visit: Payer: Self-pay

## 2020-06-19 DIAGNOSIS — E039 Hypothyroidism, unspecified: Secondary | ICD-10-CM

## 2020-06-19 DIAGNOSIS — K760 Fatty (change of) liver, not elsewhere classified: Secondary | ICD-10-CM

## 2020-06-19 DIAGNOSIS — E1149 Type 2 diabetes mellitus with other diabetic neurological complication: Secondary | ICD-10-CM

## 2020-06-19 NOTE — Progress Notes (Signed)
Established Patient Office Visit  Subjective:  Patient ID: Isabel Jimenez, female    DOB: 05-23-1961  Age: 59 y.o. MRN: [id number]  CC: No chief complaint on file. Hypothyroidism.  HPI Isabel Jimenez  presents for 6 month follow up. Since Feb., has lost 25 lbs.   A1C levels are well. Pt reports taking metFormin for weight loss, appetite supressant.    Past Medical History:  Diagnosis Date  . Asthma   . Cancer ([place])    skin  . COPD (chronic obstructive pulmonary disease) ([place])   . Joint pain   . Lupus ([place])   . Migraines   . Thyroid disease     Past Surgical History:  Procedure Laterality Date  . SQUAMOUS CELL CARCINOMA EXCISION     Left Shoulder  . TUBAL LIGATION      Family History  Problem Relation Age of Onset  . Cancer Mother        Breast  . Cancer Father        Colon  . Renal Disease Father     Social History   Socioeconomic History  . Marital status: Married    Spouse name: Not on file  . Number of children: Not on file  . Years of education: Not on file  . Highest education level: Not on file  Occupational History  . Not on file  Tobacco Use  . Smoking status: Former Smoker    Packs/day: 0.50    Years: 45.00    Pack years: 22.50    Types: Cigarettes  . Smokeless tobacco: Never Used  . Tobacco comment: last cig 04/26/16  Substance and Sexual Activity  . Alcohol use: No    Alcohol/week: 0.0 standard drinks  . Drug use: No  . Sexual activity: Not on file  Other Topics Concern  . Not on file  Social History Narrative  . Not on file   Social Determinants of Health   Financial Resource Strain:   . Difficulty of Paying Living Expenses:   Food Insecurity:   . Worried About Charity fundraiser in the Last Year:   . Arboriculturist in the Last Year:   Transportation Needs:   . Film/video editor (Medical):   Marland Kitchen Lack of Transportation (Non-Medical):   Physical Activity:   . Days of Exercise per Week:   . Minutes of Exercise per  Session:   Stress:   . Feeling of Stress :   Social Connections:   . Frequency of Communication with Friends and Family:   . Frequency of Social Gatherings with Friends and Family:   . Attends Religious Services:   . Active Member of Clubs or Organizations:   . Attends Archivist Meetings:   Marland Kitchen Marital Status:   Intimate Partner Violence:   . Fear of Current or Ex-Partner:   . Emotionally Abused:   Marland Kitchen Physically Abused:   . Sexually Abused:     Outpatient Medications Prior to Visit  Medication Sig Dispense Refill  . albuterol (PROVENTIL HFA;VENTOLIN HFA) 108 (90 Base) MCG/ACT inhaler Inhale 1-2 puffs into the lungs every 4 (four) hours as needed for wheezing or shortness of breath.    Marland Kitchen albuterol (PROVENTIL) (2.5 MG/3ML) 0.083% nebulizer solution Take 2.5 mg by nebulization every 6 (six) hours as needed for wheezing or shortness of breath.    Marland Kitchen amLODipine (NORVASC) 5 MG tablet Take 5 mg by mouth daily.    Marland Kitchen azaTHIOprine (IMURAN) 50 MG tablet  Take 50 mg by mouth 2 (two) times daily.    . cyclobenzaprine (FLEXERIL) 5 MG tablet Take 5 mg by mouth 2 (two) times daily as needed for muscle spasms.    . fluticasone (FLONASE) 50 MCG/ACT nasal spray PLACE 1 SPRAY INTO BOTH NOSTRIL DAILY 16 g 0  . Fluticasone-Umeclidin-Vilant (TRELEGY ELLIPTA) 100-62.5-25 MCG/INH AEPB Inhale into the lungs once.     . folic acid (FOLVITE) 1 MG tablet Take 1 mg by mouth daily. Reported on 04/27/2016    . gabapentin (NEURONTIN) 300 MG capsule Take 300 mg by mouth 2 (two) times daily.    Marland Kitchen ibuprofen (ADVIL) 200 MG tablet Take 200 mg by mouth every 6 (six) hours as needed.    Marland Kitchen levothyroxine (SYNTHROID) 137 MCG tablet Take 137 mcg by mouth daily before breakfast.    . metFORMIN (GLUCOPHAGE-XR) 500 MG 24 hr tablet Take 1,000 mg by mouth 2 (two) times daily.    . mometasone (ELOCON) 0.1 % ointment Apply topically as needed. 45 g 2  . montelukast (SINGULAIR) 10 MG tablet Take 10 mg by mouth at bedtime.    Marland Kitchen  omeprazole (PRILOSEC) 40 MG capsule Take 40 mg by mouth daily.    Marland Kitchen triamcinolone cream (KENALOG) 0.1 % Apply 1 application topically as needed. 30 g 2   No facility-administered medications prior to visit.    Allergies  Allergen Reactions  . Methotrexate Derivatives Anaphylaxis  . Cefdinir   . Chantix [Varenicline]     rash  . Codeine     SOB, itching  . Plaquenil [Hydroxychloroquine] Other (See Comments)    Hair loss and rash    ROS Review of Systems    Objective:    Physical Exam  There were no vitals taken for this visit. Wt Readings from Last 3 Encounters:  12/13/19 255 lb 4.8 oz (115.8 kg)  11/23/18 227 lb 14.4 oz (103.4 kg)  05/11/18 226 lb 9.6 oz (102.8 kg)     Health Maintenance Due  Topic Date Due  . FOOT EXAM  Never done  . OPHTHALMOLOGY EXAM  Never done  . HIV Screening  Never done  . PAP SMEAR-Modifier  Never done  . MAMMOGRAM  Never done  . COLONOSCOPY  Never done    There are no preventive care reminders to display for this patient.  Lab Results  Component Value Date   TSH 0.554 06/12/2020   Lab Results  Component Value Date   WBC 4.6 06/12/2020   HGB 13.2 06/12/2020   HCT 40.7 06/12/2020   MCV 93 06/12/2020   PLT 312 06/12/2020   Lab Results  Component Value Date   NA 141 06/12/2020   K 4.3 06/12/2020   CO2 24 06/12/2020   GLUCOSE 98 06/12/2020   BUN 12 06/12/2020   CREATININE 0.83 06/12/2020   BILITOT 0.3 06/12/2020   ALKPHOS 105 06/12/2020   AST 24 06/12/2020   ALT 33 (H) 06/12/2020   PROT 7.1 06/12/2020   ALBUMIN 4.2 06/12/2020   CALCIUM 9.2 06/12/2020   ANIONGAP 6 04/18/2016   Lab Results  Component Value Date   CHOL 201 (H) 12/06/2019   Lab Results  Component Value Date   HDL 63 12/06/2019   Lab Results  Component Value Date   LDLCALC 110 (H) 12/06/2019   Lab Results  Component Value Date   TRIG 160 (H) 12/06/2019   Lab Results  Component Value Date   CHOLHDL 3.2 12/06/2019   Lab Results  Component  Value  Date   HGBA1C 5.6 06/12/2020      Assessment & Plan:   Problem List Items Addressed This Visit      Endocrine   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 Morbid obesity ([place]) Working with specialist at [facility], uses metFORMIN for appetites suppression. Reports 25 lb weight reduction.   2. Non-alcoholic fatty liver disease Persistent mild LLT. Liver function studies.  3. Hypothyroidism, unspecified type Being followed at [facility], has recency had her Synthroid medication increased to 137 mcg. Current labs suggest she is now therapeutic.    4.Lupus Followed by specialist at [facility]. Appears to be stable, no medication changes.  FU in six months with labs.   No orders of the defined types were placed in this encounter.   Follow-up: No follow-ups on file.    Tishara Pizano Charyl Bigger

## 2020-09-03 ENCOUNTER — Other Ambulatory Visit: Payer: Self-pay | Admitting: Endocrinology

## 2020-11-04 ENCOUNTER — Other Ambulatory Visit: Payer: Self-pay | Admitting: Family Medicine

## 2020-11-05 ENCOUNTER — Other Ambulatory Visit: Payer: Self-pay | Admitting: Family Medicine

## 2020-11-12 ENCOUNTER — Other Ambulatory Visit: Payer: Self-pay | Admitting: Family Medicine

## 2020-11-14 ENCOUNTER — Other Ambulatory Visit: Payer: Self-pay | Admitting: Neurology

## 2020-11-15 ENCOUNTER — Other Ambulatory Visit: Payer: Self-pay | Admitting: Internal Medicine

## 2020-12-11 ENCOUNTER — Other Ambulatory Visit: Payer: Self-pay

## 2020-12-11 VITALS — HR 66 | Temp 98.1°F | Ht 64.0 in | Wt 233.0 lb

## 2020-12-11 DIAGNOSIS — E1149 Type 2 diabetes mellitus with other diabetic neurological complication: Secondary | ICD-10-CM

## 2020-12-13 ENCOUNTER — Encounter: Payer: Self-pay | Admitting: Internal Medicine

## 2020-12-13 LAB — LIPID PANEL
Chol/HDL Ratio: 3.5 ratio (ref 0.0–4.4)
Cholesterol, Total: 230 mg/dL — ABNORMAL HIGH (ref 100–199)
HDL: 65 mg/dL (ref >39)
LDL Chol Calc (NIH): 137 mg/dL — ABNORMAL HIGH (ref 0–99)
Triglycerides: 160 mg/dL — ABNORMAL HIGH (ref 0–149)
VLDL Cholesterol Cal: 28 mg/dL (ref 5–40)

## 2020-12-13 LAB — CBC WITH DIFFERENTIAL/PLATELET
Basophils Absolute: 0 10*3/uL (ref 0.0–0.2)
Basos: 1 %
EOS (ABSOLUTE): 0.1 10*3/uL (ref 0.0–0.4)
Eos: 2 %
Hematocrit: 41.8 % (ref 34.0–46.6)
Hemoglobin: 13.8 g/dL (ref 11.1–15.9)
Immature Grans (Abs): 0 10*3/uL (ref 0.0–0.1)
Immature Granulocytes: 1 %
Lymphocytes Absolute: 0.9 10*3/uL (ref 0.7–3.1)
Lymphs: 20 %
MCH: 30.2 pg (ref 26.6–33.0)
MCHC: 33 g/dL (ref 31.5–35.7)
MCV: 92 fL (ref 79–97)
Monocytes Absolute: 0.6 10*3/uL (ref 0.1–0.9)
Monocytes: 15 %
Neutrophils Absolute: 2.7 10*3/uL (ref 1.4–7.0)
Neutrophils: 61 %
Platelets: 307 10*3/uL (ref 150–450)
RBC: 4.57 x10E6/uL (ref 3.77–5.28)
RDW: 13.8 % (ref 11.7–15.4)
WBC: 4.3 10*3/uL (ref 3.4–10.8)

## 2020-12-13 LAB — COMPREHENSIVE METABOLIC PANEL
ALT: 32 IU/L (ref 0–32)
AST: 28 IU/L (ref 0–40)
Albumin/Globulin Ratio: 1.4 (ref 1.2–2.2)
Albumin: 4.2 g/dL (ref 3.8–4.8)
Alkaline Phosphatase: 98 IU/L (ref 44–121)
BUN/Creatinine Ratio: 14 (ref 12–28)
BUN: 12 mg/dL (ref 8–27)
Bilirubin Total: 0.3 mg/dL (ref 0.0–1.2)
CO2: 25 mmol/L (ref 20–29)
Calcium: 8.9 mg/dL (ref 8.7–10.3)
Chloride: 103 mmol/L (ref 96–106)
Creatinine, Ser: 0.87 mg/dL (ref 0.57–1.00)
GFR calc Af Amer: 83 mL/min/{1.73_m2} (ref >59)
GFR calc non Af Amer: 72 mL/min/{1.73_m2} (ref >59)
Globulin, Total: 2.9 g/dL (ref 1.5–4.5)
Glucose: 100 mg/dL — ABNORMAL HIGH (ref 65–99)
Potassium: 4.2 mmol/L (ref 3.5–5.2)
Sodium: 142 mmol/L (ref 134–144)
Total Protein: 7.1 g/dL (ref 6.0–8.5)

## 2020-12-13 LAB — URINALYSIS

## 2020-12-13 LAB — T4: T4, Total: 10.8 ug/dL (ref 4.5–12.0)

## 2020-12-13 LAB — TSH: TSH: 0.536 u[IU]/mL (ref 0.450–4.500)

## 2020-12-13 LAB — HEMOGLOBIN A1C
Est. average glucose Bld gHb Est-mCnc: 114 mg/dL
Hgb A1c MFr Bld: 5.6 % (ref 4.8–5.6)

## 2020-12-18 ENCOUNTER — Other Ambulatory Visit: Payer: Self-pay | Admitting: Internal Medicine

## 2020-12-18 ENCOUNTER — Encounter: Payer: Self-pay | Admitting: Internal Medicine

## 2020-12-18 ENCOUNTER — Telehealth: Payer: Self-pay | Admitting: Internal Medicine

## 2020-12-18 ENCOUNTER — Other Ambulatory Visit: Payer: Self-pay

## 2020-12-18 DIAGNOSIS — J449 Chronic obstructive pulmonary disease, unspecified: Secondary | ICD-10-CM

## 2020-12-18 MED ORDER — FLUTICASONE PROPIONATE 50 MCG/ACT NA SUSP
NASAL | 0 refills | Status: DC
Start: 2020-12-18 — End: 2020-12-18

## 2020-12-18 NOTE — Progress Notes (Signed)
Established Patient Office Visit  Subjective:  Patient ID: Isabel Jimenez, female    DOB: Mar 18, 1961  Age: 60 y.o. MRN: [id number]  CC:  Chief Complaint  Patient presents with  . Follow-up  . COPD    HPI Isabel Jimenez presents for follow up.   Past Medical History:  Diagnosis Date  . Asthma   . Cancer ([place])    skin  . COPD (chronic obstructive pulmonary disease) ([place])   . Joint pain   . Lupus ([place])   . Migraines   . Thyroid disease     Past Surgical History:  Procedure Laterality Date  . SQUAMOUS CELL CARCINOMA EXCISION     Left Shoulder  . TUBAL LIGATION      Family History  Problem Relation Age of Onset  . Cancer Mother        Breast  . Cancer Father        Colon  . Renal Disease Father     Social History   Socioeconomic History  . Marital status: Married    Spouse name: Not on file  . Number of children: Not on file  . Years of education: Not on file  . Highest education level: Not on file  Occupational History  . Not on file  Tobacco Use  . Smoking status: Former Smoker    Packs/day: 0.50    Years: 45.00    Pack years: 22.50    Types: Cigarettes  . Smokeless tobacco: Never Used  . Tobacco comment: last cig 04/26/16  Substance and Sexual Activity  . Alcohol use: No    Alcohol/week: 0.0 standard drinks  . Drug use: No  . Sexual activity: Not on file  Other Topics Concern  . Not on file  Social History Narrative  . Not on file   Social Determinants of Health   Financial Resource Strain: Not on file  Food Insecurity: Not on file  Transportation Needs: Not on file  Physical Activity: Not on file  Stress: Not on file  Social Connections: Not on file  Intimate Partner Violence: Not on file    Outpatient Medications Prior to Visit  Medication Sig Dispense Refill  . albuterol (PROVENTIL HFA;VENTOLIN HFA) 108 (90 Base) MCG/ACT inhaler Inhale 1-2 puffs into the lungs every 4 (four) hours as needed for wheezing or shortness of breath.     Marland Kitchen albuterol (PROVENTIL) (2.5 MG/3ML) 0.083% nebulizer solution Take 2.5 mg by nebulization every 6 (six) hours as needed for wheezing or shortness of breath.    Marland Kitchen amLODipine (NORVASC) 5 MG tablet Take 5 mg by mouth daily.    Marland Kitchen azaTHIOprine (IMURAN) 50 MG tablet Take 50 mg by mouth 2 (two) times daily.    . cyclobenzaprine (FLEXERIL) 5 MG tablet Take 5 mg by mouth 2 (two) times daily as needed for muscle spasms.    . fluticasone (FLONASE) 50 MCG/ACT nasal spray PLACE 1 SPRAY INTO BOTH NOSTRIL DAILY 16 g 0  . Fluticasone-Umeclidin-Vilant (TRELEGY ELLIPTA) 100-62.5-25 MCG/INH AEPB Inhale into the lungs once.     . folic acid (FOLVITE) 1 MG tablet Take 1 mg by mouth daily. Reported on 04/27/2016    . gabapentin (NEURONTIN) 300 MG capsule Take 300 mg by mouth 2 (two) times daily.    Marland Kitchen ibuprofen (ADVIL) 200 MG tablet Take 200 mg by mouth every 6 (six) hours as needed.    Marland Kitchen levothyroxine (SYNTHROID) 137 MCG tablet Take 137 mcg by mouth daily before breakfast.    .  metFORMIN (GLUCOPHAGE-XR) 500 MG 24 hr tablet Take 1,000 mg by mouth 2 (two) times daily.    . mometasone (ELOCON) 0.1 % ointment Apply topically as needed. 45 g 2  . montelukast (SINGULAIR) 10 MG tablet Take 10 mg by mouth at bedtime.    Marland Kitchen omeprazole (PRILOSEC) 40 MG capsule Take 40 mg by mouth daily.    Marland Kitchen triamcinolone cream (KENALOG) 0.1 % Apply 1 application topically as needed. 30 g 2   No facility-administered medications prior to visit.    Allergies  Allergen Reactions  . Methotrexate Derivatives Anaphylaxis  . Cefdinir   . Chantix [Varenicline]     rash  . Codeine     SOB, itching  . Plaquenil [Hydroxychloroquine] Other (See Comments)    Hair loss and rash    ROS Review of Systems    Objective:    Physical Exam  There were no vitals taken for this visit. Wt Readings from Last 3 Encounters:  12/11/20 233 lb (105.7 kg)  12/13/19 255 lb 4.8 oz (115.8 kg)  11/23/18 227 lb 14.4 oz (103.4 kg)     Health  Maintenance Due  Topic Date Due  . FOOT EXAM  Never done  . OPHTHALMOLOGY EXAM  Never done  . URINE MICROALBUMIN  Never done  . HIV Screening  Never done  . PAP SMEAR-Modifier  Never done  . COLONOSCOPY (Pts 45-26yrs Insurance coverage will need to be confirmed)  Never done  . MAMMOGRAM  Never done  . COVID-19 Vaccine (2 - Booster for Janssen series) 04/25/2020    There are no preventive care reminders to display for this patient.  Lab Results  Component Value Date   TSH 0.536 12/11/2020   Lab Results  Component Value Date   WBC 4.3 12/11/2020   HGB 13.8 12/11/2020   HCT 41.8 12/11/2020   MCV 92 12/11/2020   PLT 307 12/11/2020   Lab Results  Component Value Date   NA 142 12/11/2020   K 4.2 12/11/2020   CO2 25 12/11/2020   GLUCOSE 100 (H) 12/11/2020   BUN 12 12/11/2020   CREATININE 0.87 12/11/2020   BILITOT 0.3 12/11/2020   ALKPHOS 98 12/11/2020   AST 28 12/11/2020   ALT 32 12/11/2020   PROT 7.1 12/11/2020   ALBUMIN 4.2 12/11/2020   CALCIUM 8.9 12/11/2020   ANIONGAP 6 04/18/2016   Lab Results  Component Value Date   CHOL 230 (H) 12/11/2020   Lab Results  Component Value Date   HDL 65 12/11/2020   Lab Results  Component Value Date   LDLCALC 137 (H) 12/11/2020   Lab Results  Component Value Date   TRIG 160 (H) 12/11/2020   Lab Results  Component Value Date   CHOLHDL 3.5 12/11/2020   Lab Results  Component Value Date   HGBA1C 5.6 12/11/2020      Assessment & Plan:   Problem List Items Addressed This Visit      Respiratory   COPD (chronic obstructive pulmonary disease) ([place]) - Primary      No orders of the defined types were placed in this encounter. 1. Chronic obstructive pulmonary disease, unspecified COPD type [facility]) Patient had  flare up of her COPD and was seen at the [facility] emergency room. Seems to be repsonding to tthe therapy of a steroid taper. It was noted that she had a fair amount of cardiac arythmia and is wearing a Zio patch for  monitoing.   Has follow up with [facility] primary care  doctor.   2. Hypothyroid Thyroid labs appear to be therapeutic. No changes needed.   3.Morbid Obesity Pt has no evidence of diabetes but was put on Metformin in attempt to help with weight reduction by [facility] rprimary care group. She has stopped the metfomin on her own, pending follow up by [place].  Her diabetic A1C levels have been normal on multiple checks  FU in 6 months with labs in office.    Follow-up: Return in about 26 weeks (around 06/18/2021).    Nakari Bracknell Charyl Bigger

## 2020-12-19 ENCOUNTER — Telehealth: Payer: Self-pay | Admitting: Pharmacist

## 2020-12-19 NOTE — Telephone Encounter (Signed)
12/19/2020 4:06:18 PM - Trelegy Ellipta/gsk renewal to pat & dr  -- Elmer Picker - Thursday, December 19, 2020 4:03 PM --Mailing [place] renewal application to patient to sign and return with 2022 income, also mailing script for Trelegy Ellipta inhale 1 puff into the lungs daily to provider Dr. Tonette Lederer @ [facility]  [place], [place] [address]  [place] for provider to sign & return.   12/19/2020 4:03:55 PM - ProAir HFA replaces Ventolin HFA forms to pt & dr  -- Elmer Picker - Thursday, December 19, 2020 4:02 PM --ProAir HFA replaces Ventolin, printed [employer] application-sending to [facility] for Benjamine Mola to sign & return, also mailing patient her portion to sign and return with 2022 income.

## 2020-12-25 ENCOUNTER — Other Ambulatory Visit: Payer: Self-pay | Admitting: Family Medicine

## 2021-01-01 ENCOUNTER — Other Ambulatory Visit: Payer: Self-pay | Admitting: Internal Medicine

## 2021-01-01 ENCOUNTER — Telehealth: Payer: Self-pay | Admitting: Pharmacist

## 2021-01-01 NOTE — Telephone Encounter (Signed)
01/01/2021 3:05:38 PM - ProAir HFA pending ° -- Annette Johnson - Wednesday, January 01, 2021 3:04 PM --I have received the signed provider script & form for ProAir HFA-Patient signed her portion 12/26/2020--holding for patient to bring current income/support before I can send in to company. °

## 2021-01-16 ENCOUNTER — Other Ambulatory Visit: Payer: Self-pay | Admitting: Family Medicine

## 2021-02-10 ENCOUNTER — Telehealth: Payer: Self-pay | Admitting: Pharmacist

## 2021-02-10 NOTE — Telephone Encounter (Signed)
01/01/2021 3:05:38 PM - ProAir HFA pending  -- Elmer Picker - Wednesday, January 01, 2021 3:04 PM --I have received the signed provider script & form for ProAir HFA-Patient signed her portion 12/26/2020--holding for patient to bring current income/support before I can send in to company.

## 2021-02-11 ENCOUNTER — Other Ambulatory Visit: Payer: Self-pay | Admitting: Family Medicine

## 2021-02-19 ENCOUNTER — Other Ambulatory Visit: Payer: Self-pay

## 2021-02-24 ENCOUNTER — Other Ambulatory Visit: Payer: Self-pay

## 2021-03-07 ENCOUNTER — Other Ambulatory Visit: Payer: Self-pay | Admitting: Internal Medicine

## 2021-03-10 ENCOUNTER — Other Ambulatory Visit: Payer: Self-pay

## 2021-03-10 MED ORDER — FLUTICASONE PROPIONATE 50 MCG/ACT NA SUSP
NASAL | 11 refills | Status: DC
  Filled 2021-03-10: qty 16, 60d supply, fill #0

## 2021-03-10 MED ORDER — MONTELUKAST SODIUM 10 MG PO TABS
1.0000 | ORAL_TABLET | Freq: Every day | ORAL | 11 refills | Status: DC
  Filled 2021-03-10: qty 30, 30d supply, fill #0
  Filled 2021-03-27 – 2021-04-10 (×2): qty 30, 30d supply, fill #1
  Filled 2021-05-03: qty 30, 30d supply, fill #2
  Filled 2021-05-29 – 2021-06-03 (×2): qty 30, 30d supply, fill #3
  Filled 2021-07-03: qty 30, 30d supply, fill #4
  Filled 2021-08-13: qty 30, 30d supply, fill #5

## 2021-03-10 MED ORDER — PREDNISONE 20 MG PO TABS
ORAL_TABLET | ORAL | 0 refills | Status: DC
  Filled 2021-03-10: qty 10, 5d supply, fill #0

## 2021-03-10 MED ORDER — FLUTICASONE PROPIONATE 50 MCG/ACT NA SUSP
NASAL | 11 refills | Status: DC
  Filled 2021-03-10: qty 16, 30d supply, fill #0
  Filled 2021-04-25: qty 16, 30d supply, fill #1
  Filled 2021-04-28: qty 16, 60d supply, fill #1
  Filled 2021-05-21 – 2021-06-25 (×4): qty 16, 60d supply, fill #2
  Filled 2021-08-18: qty 16, 60d supply, fill #3
  Filled 2021-12-15: qty 16, 60d supply, fill #4
  Filled 2022-02-12: qty 16, 60d supply, fill #5

## 2021-03-11 ENCOUNTER — Other Ambulatory Visit: Payer: Self-pay

## 2021-03-11 MED ORDER — TRIAMCINOLONE ACETONIDE 0.1 % EX CREA
TOPICAL_CREAM | Freq: Two times a day (BID) | CUTANEOUS | 2 refills | Status: DC | PRN
  Filled 2021-03-11: qty 80, 30d supply, fill #0
  Filled 2021-07-18: qty 80, 30d supply, fill #1
  Filled 2022-02-12: qty 80, 30d supply, fill #2

## 2021-03-11 MED ORDER — AZATHIOPRINE 50 MG PO TABS
ORAL_TABLET | ORAL | 0 refills | Status: DC
  Filled 2021-03-11: qty 180, 60d supply, fill #0

## 2021-03-12 ENCOUNTER — Other Ambulatory Visit: Payer: Self-pay

## 2021-03-21 ENCOUNTER — Other Ambulatory Visit: Payer: Self-pay

## 2021-03-27 ENCOUNTER — Other Ambulatory Visit: Payer: Self-pay

## 2021-03-27 MED FILL — Metformin HCl Tab ER 24HR 500 MG: ORAL | 30 days supply | Qty: 120 | Fill #0 | Status: AC

## 2021-03-27 MED FILL — Gabapentin Cap 300 MG: ORAL | 30 days supply | Qty: 60 | Fill #0 | Status: AC

## 2021-03-27 MED FILL — Levothyroxine Sodium Tab 137 MCG: ORAL | 90 days supply | Qty: 90 | Fill #0 | Status: AC

## 2021-03-28 ENCOUNTER — Other Ambulatory Visit: Payer: Self-pay

## 2021-04-08 ENCOUNTER — Other Ambulatory Visit: Payer: Self-pay

## 2021-04-10 ENCOUNTER — Other Ambulatory Visit: Payer: Self-pay

## 2021-04-10 MED FILL — Omeprazole Cap Delayed Release 40 MG: ORAL | 90 days supply | Qty: 90 | Fill #0 | Status: AC

## 2021-04-11 ENCOUNTER — Other Ambulatory Visit: Payer: Self-pay

## 2021-04-11 MED FILL — Albuterol Sulfate Inhal Aero 108 MCG/ACT (90MCG Base Equiv): RESPIRATORY_TRACT | 75 days supply | Qty: 25.5 | Fill #0 | Status: AC

## 2021-04-24 ENCOUNTER — Other Ambulatory Visit: Payer: Self-pay

## 2021-04-25 ENCOUNTER — Other Ambulatory Visit: Payer: Self-pay

## 2021-04-28 ENCOUNTER — Other Ambulatory Visit: Payer: Self-pay

## 2021-04-28 MED ORDER — KETOCONAZOLE 2 % EX SHAM
MEDICATED_SHAMPOO | CUTANEOUS | 11 refills | Status: DC
  Filled 2021-04-28: qty 120, 30d supply, fill #0
  Filled 2021-07-18: qty 120, 30d supply, fill #1

## 2021-04-28 MED ORDER — KETOCONAZOLE 2 % EX CREA
TOPICAL_CREAM | CUTANEOUS | 11 refills | Status: DC
  Filled 2021-04-28 – 2021-05-03 (×2): qty 15, 15d supply, fill #0
  Filled 2021-07-18: qty 60, 60d supply, fill #1

## 2021-04-28 MED FILL — Gabapentin Cap 300 MG: ORAL | 30 days supply | Qty: 60 | Fill #1 | Status: AC

## 2021-04-28 MED FILL — Metformin HCl Tab ER 24HR 500 MG: ORAL | 30 days supply | Qty: 120 | Fill #1 | Status: AC

## 2021-04-29 ENCOUNTER — Other Ambulatory Visit: Payer: Self-pay

## 2021-04-30 ENCOUNTER — Other Ambulatory Visit: Payer: Self-pay

## 2021-04-30 MED ORDER — ALBUTEROL SULFATE (2.5 MG/3ML) 0.083% IN NEBU
INHALATION_SOLUTION | RESPIRATORY_TRACT | 3 refills | Status: DC
  Filled 2021-04-30: qty 180, 15d supply, fill #0
  Filled 2021-10-24: qty 180, 15d supply, fill #1

## 2021-05-02 ENCOUNTER — Other Ambulatory Visit: Payer: Self-pay

## 2021-05-02 MED ORDER — AZATHIOPRINE 50 MG PO TABS
ORAL_TABLET | ORAL | 0 refills | Status: DC
  Filled 2021-05-12: qty 60, 30d supply, fill #0
  Filled 2021-06-25 (×2): qty 180, 90d supply, fill #0

## 2021-05-03 MED FILL — Fluticasone-Umeclidinium-Vilanterol AEPB 100-62.5-25 MCG/ACT: RESPIRATORY_TRACT | 30 days supply | Qty: 60 | Fill #0 | Status: CN

## 2021-05-06 ENCOUNTER — Other Ambulatory Visit: Payer: Self-pay

## 2021-05-12 ENCOUNTER — Other Ambulatory Visit: Payer: Self-pay

## 2021-05-12 MED FILL — Amlodipine Besylate Tab 5 MG (Base Equivalent): ORAL | 90 days supply | Qty: 90 | Fill #0 | Status: CN

## 2021-05-12 MED FILL — Metoprolol Succinate Tab ER 24HR 25 MG (Tartrate Equiv): ORAL | 90 days supply | Qty: 90 | Fill #0 | Status: CN

## 2021-05-13 ENCOUNTER — Other Ambulatory Visit: Payer: Self-pay

## 2021-05-14 ENCOUNTER — Other Ambulatory Visit: Payer: Self-pay

## 2021-05-20 ENCOUNTER — Other Ambulatory Visit: Payer: Self-pay

## 2021-05-20 MED FILL — Atorvastatin Calcium Tab 40 MG (Base Equivalent): ORAL | 30 days supply | Qty: 30 | Fill #0 | Status: AC

## 2021-05-21 ENCOUNTER — Other Ambulatory Visit: Payer: Self-pay

## 2021-05-21 MED ORDER — AZITHROMYCIN 250 MG PO TABS
ORAL_TABLET | ORAL | 0 refills | Status: DC
  Filled 2021-05-21: qty 6, 5d supply, fill #0

## 2021-05-21 MED FILL — Metformin HCl Tab ER 24HR 500 MG: ORAL | 30 days supply | Qty: 120 | Fill #2 | Status: CN

## 2021-05-22 ENCOUNTER — Other Ambulatory Visit: Payer: Self-pay

## 2021-05-29 ENCOUNTER — Other Ambulatory Visit: Payer: Self-pay

## 2021-05-29 MED FILL — Metformin HCl Tab ER 24HR 500 MG: ORAL | 30 days supply | Qty: 120 | Fill #2 | Status: AC

## 2021-06-03 ENCOUNTER — Other Ambulatory Visit: Payer: Self-pay

## 2021-06-03 MED ORDER — CYCLOBENZAPRINE HCL 5 MG PO TABS
ORAL_TABLET | ORAL | 1 refills | Status: DC
  Filled 2021-06-03: qty 60, 30d supply, fill #0
  Filled 2021-07-03 – 2021-08-18 (×2): qty 60, 30d supply, fill #1

## 2021-06-03 MED ORDER — GABAPENTIN 300 MG PO CAPS
ORAL_CAPSULE | ORAL | 4 refills | Status: DC
  Filled 2021-06-03: qty 60, 30d supply, fill #0
  Filled 2021-07-09: qty 60, 30d supply, fill #1

## 2021-06-04 ENCOUNTER — Other Ambulatory Visit: Payer: Self-pay

## 2021-06-05 ENCOUNTER — Other Ambulatory Visit: Payer: Self-pay

## 2021-06-05 MED ORDER — GABAPENTIN 300 MG PO CAPS
ORAL_CAPSULE | ORAL | 4 refills | Status: DC
  Filled 2021-07-03: qty 60, 30d supply, fill #0
  Filled 2021-09-06: qty 60, 30d supply, fill #1
  Filled 2021-10-24: qty 60, 30d supply, fill #2
  Filled 2021-12-13: qty 60, 30d supply, fill #3
  Filled 2022-01-03 – 2022-01-09 (×2): qty 60, 30d supply, fill #4

## 2021-06-10 ENCOUNTER — Other Ambulatory Visit: Payer: Self-pay

## 2021-06-10 MED ORDER — DOXYCYCLINE HYCLATE 100 MG PO CAPS
ORAL_CAPSULE | ORAL | 0 refills | Status: DC
  Filled 2021-06-10: qty 14, 7d supply, fill #0

## 2021-06-10 MED ORDER — PREDNISONE 20 MG PO TABS
ORAL_TABLET | ORAL | 0 refills | Status: DC
  Filled 2021-06-10: qty 10, 5d supply, fill #0

## 2021-06-11 ENCOUNTER — Other Ambulatory Visit: Payer: Self-pay

## 2021-06-12 LAB — URINALYSIS, ROUTINE W REFLEX MICROSCOPIC
Bilirubin, UA: NEGATIVE
Glucose, UA: NEGATIVE
Ketones, UA: NEGATIVE
Nitrite, UA: NEGATIVE
Protein,UA: NEGATIVE
RBC, UA: NEGATIVE
Specific Gravity, UA: 1.006 (ref 1.005–1.030)
Urobilinogen, Ur: 1 mg/dL (ref 0.2–1.0)
pH, UA: 6 (ref 5.0–7.5)

## 2021-06-12 LAB — CBC WITH DIFFERENTIAL/PLATELET
Basophils Absolute: 0 10*3/uL (ref 0.0–0.2)
Basos: 0 %
EOS (ABSOLUTE): 0.1 10*3/uL (ref 0.0–0.4)
Eos: 2 %
Hematocrit: 40.3 % (ref 34.0–46.6)
Hemoglobin: 12.8 g/dL (ref 11.1–15.9)
Immature Grans (Abs): 0 10*3/uL (ref 0.0–0.1)
Immature Granulocytes: 0 %
Lymphocytes Absolute: 0.8 10*3/uL (ref 0.7–3.1)
Lymphs: 11 %
MCH: 29.7 pg (ref 26.6–33.0)
MCHC: 31.8 g/dL (ref 31.5–35.7)
MCV: 94 fL (ref 79–97)
Monocytes Absolute: 0.8 10*3/uL (ref 0.1–0.9)
Monocytes: 11 %
Neutrophils Absolute: 5.5 10*3/uL (ref 1.4–7.0)
Neutrophils: 76 %
Platelets: 355 10*3/uL (ref 150–450)
RBC: 4.31 x10E6/uL (ref 3.77–5.28)
RDW: 13.6 % (ref 11.7–15.4)
WBC: 7.2 10*3/uL (ref 3.4–10.8)

## 2021-06-12 LAB — COMPREHENSIVE METABOLIC PANEL
ALT: 22 IU/L (ref 0–32)
AST: 16 IU/L (ref 0–40)
Albumin/Globulin Ratio: 1.4 (ref 1.2–2.2)
Albumin: 4 g/dL (ref 3.8–4.8)
Alkaline Phosphatase: 95 IU/L (ref 44–121)
BUN/Creatinine Ratio: 19 (ref 12–28)
BUN: 16 mg/dL (ref 8–27)
Bilirubin Total: 0.4 mg/dL (ref 0.0–1.2)
CO2: 24 mmol/L (ref 20–29)
Calcium: 8.6 mg/dL — ABNORMAL LOW (ref 8.7–10.3)
Chloride: 100 mmol/L (ref 96–106)
Creatinine, Ser: 0.86 mg/dL (ref 0.57–1.00)
Globulin, Total: 2.9 g/dL (ref 1.5–4.5)
Glucose: 91 mg/dL (ref 65–99)
Potassium: 3.9 mmol/L (ref 3.5–5.2)
Sodium: 142 mmol/L (ref 134–144)
Total Protein: 6.9 g/dL (ref 6.0–8.5)
eGFR: 76 mL/min/{1.73_m2} (ref >59)

## 2021-06-12 LAB — MICROSCOPIC EXAMINATION
Casts: NONE SEEN /lpf
RBC, Urine: NONE SEEN /hpf (ref 0–2)

## 2021-06-12 LAB — TSH: TSH: 0.645 u[IU]/mL (ref 0.450–4.500)

## 2021-06-18 ENCOUNTER — Other Ambulatory Visit: Payer: Self-pay

## 2021-06-18 ENCOUNTER — Encounter: Payer: Self-pay | Admitting: Internal Medicine

## 2021-06-18 ENCOUNTER — Ambulatory Visit: Payer: Self-pay | Admitting: Internal Medicine

## 2021-06-18 DIAGNOSIS — J189 Pneumonia, unspecified organism: Secondary | ICD-10-CM

## 2021-06-18 DIAGNOSIS — E039 Hypothyroidism, unspecified: Secondary | ICD-10-CM

## 2021-06-18 MED ORDER — PREDNISONE 5 MG PO TABS
ORAL_TABLET | ORAL | 0 refills | Status: AC
  Filled 2021-06-18: qty 21, 6d supply, fill #0

## 2021-06-18 MED ORDER — ATORVASTATIN CALCIUM 40 MG PO TABS
ORAL_TABLET | Freq: Every day | ORAL | 3 refills | Status: DC
  Filled 2021-06-18: qty 90, 90d supply, fill #0
  Filled 2021-09-17: qty 90, 90d supply, fill #1
  Filled 2021-12-20: qty 90, 90d supply, fill #2
  Filled 2022-04-04: qty 90, 90d supply, fill #3

## 2021-06-18 MED ORDER — LEVOTHYROXINE SODIUM 137 MCG PO TABS
ORAL_TABLET | Freq: Every day | ORAL | 0 refills | Status: DC
  Filled 2021-06-18 – 2021-06-25 (×2): qty 30, 30d supply, fill #0
  Filled 2021-07-22: qty 30, 30d supply, fill #1
  Filled 2021-08-15 – 2021-08-18 (×2): qty 30, 30d supply, fill #2

## 2021-06-18 NOTE — Progress Notes (Signed)
Established Patient Office Visit  Subjective:  Patient ID: Isabel Jimenez, female    DOB: 1961/05/21  Age: 60 y.o. MRN: [id number]  CC: No chief complaint on file.   HPI Isabel Jimenez is a 60 y/o female who presents for evaluation following a pneumonia diagnosis in the [facility] ER last week. Pt feels she was doing well and now she is getting worse again. Feels she is constantly using nebulizer because the inhaler is not working. Reports temp of 99.1-99.3, down from 102 during ER visit. Pt reports taking Motrin as needed for fever. She reports finishing her steroids and antibiotics. Pt states she is coughing clear to milky white phlegm, discussed no further antibiotics are likely needed. Discussed possibility of more steroid use to help with the continued discomfort.  Labs from last week look good. Despite steroids her blood sugars were normal. Thyroid test shows that medications are working.  Past Medical History:  Diagnosis Date   Asthma    Cancer ([place])    skin   COPD (chronic obstructive pulmonary disease) (HCC)    Joint pain    Lupus (HCC)    Migraines    Thyroid disease     Past Surgical History:  Procedure Laterality Date   SQUAMOUS CELL CARCINOMA EXCISION     Left Shoulder   TUBAL LIGATION      Family History  Problem Relation Age of Onset   Cancer Mother        Breast   Cancer Father        Colon   Renal Disease Father     Social History   Socioeconomic History   Marital status: Married    Spouse name: Not on file   Number of children: Not on file   Years of education: Not on file   Highest education level: Not on file  Occupational History   Not on file  Tobacco Use   Smoking status: Former    Packs/day: 0.50    Years: 45.00    Pack years: 22.50    Types: Cigarettes   Smokeless tobacco: Never   Tobacco comments:    last cig 04/26/16  Substance and Sexual Activity   Alcohol use: No    Alcohol/week: 0.0 standard drinks   Drug use: No    Sexual activity: Not on file  Other Topics Concern   Not on file  Social History Narrative   Not on file   Social Determinants of Health   Financial Resource Strain: Not on file  Food Insecurity: Not on file  Transportation Needs: Not on file  Physical Activity: Not on file  Stress: Not on file  Social Connections: Not on file  Intimate Partner Violence: Not on file    Outpatient Medications Prior to Visit  Medication Sig Dispense Refill   albuterol (PROVENTIL HFA;VENTOLIN HFA) 108 (90 Base) MCG/ACT inhaler Inhale 1-2 puffs into the lungs every 4 (four) hours as needed for wheezing or shortness of breath.     albuterol (PROVENTIL) (2.5 MG/3ML) 0.083% nebulizer solution Take 2.5 mg by nebulization every 6 (six) hours as needed for wheezing or shortness of breath.     albuterol (PROVENTIL) (2.5 MG/3ML) 0.083% nebulizer solution INHALE CONTENTS OF ONE VIAL (3ML) ONCE EVERY 6 HOURS USING NEBULIZER AS NEEDED FOR WHEEZING OR SHORTNESS OF BREATH. 300 mL 3   albuterol (VENTOLIN HFA) 108 (90 Base) MCG/ACT inhaler INHALE 2 PUFFS EVERY 6 HOURS AS NEEDED FOR WHEEZING 18 g 11   amLODipine (NORVASC)  5 MG tablet Take 5 mg by mouth daily.     amLODipine (NORVASC) 5 MG tablet TAKE ONE TABLET BY MOUTH EVERY DAY 90 tablet 1   atorvastatin (LIPITOR) 40 MG tablet TAKE ONE TABLET BY MOUTH EVERY DAY 30 tablet 6   azaTHIOprine (IMURAN) 50 MG tablet Take 50 mg by mouth 2 (two) times daily.     azaTHIOprine (IMURAN) 50 MG tablet Take 2 tablets (100 mg total) by mouth in the morning. 180 tablet 0   azithromycin (ZITHROMAX) 250 MG tablet TAKE 2 TABLETS (500MG TOTAL) BY MOUTH ONCE DAILY ON DAY 1. THEN TAKE ONE TABLET (250MG TOTAL) BY MOUTH ONCE DAILY ON DAYS 2-5. 6 tablet 0   cyclobenzaprine (FLEXERIL) 5 MG tablet Take 5 mg by mouth 2 (two) times daily as needed for muscle spasms.     cyclobenzaprine (FLEXERIL) 5 MG tablet TAKE ONE TABLET BY MOUTH 2 TIMES A DAY AS NEEDED FOR MUSCLE SPASMS 60 tablet 1    cyclobenzaprine (FLEXERIL) 5 MG tablet Take 1 tablet (5 mg total) by mouth two (2) times a day as needed for muscle spasms. 60 tablet 1   diltiazem (CARDIZEM CD) 120 MG 24 hr capsule TAKE ONE CAPSULE BY MOUTH EVERY DAY 30 capsule 6   doxycycline (VIBRAMYCIN) 100 MG capsule Take 1 capsule (100 mg total) by mouth Two (2) times a day for 7 days. 14 capsule 0   fluticasone (FLONASE) 50 MCG/ACT nasal spray PLACE 1 SPRAY INTO BOTH NOSTRIL DAILY 16 g 0   fluticasone (FLONASE) 50 MCG/ACT nasal spray USE 1 SPRAY IN EACH NOSTRIL DAILY 16 g 11   fluticasone (FLONASE) 50 MCG/ACT nasal spray SPRAY ONE SPRAY INTO EACH NOSTRIL ONCE DAILY. 16 g 11   Fluticasone-Umeclidin-Vilant (TRELEGY ELLIPTA) 100-62.5-25 MCG/INH AEPB Inhale into the lungs once.      Fluticasone-Umeclidin-Vilant 100-62.5-25 MCG/INH AEPB INHALE ONE PUFF EVERY DAY 60 each 3   folic acid (FOLVITE) 1 MG tablet Take 1 mg by mouth daily. Reported on 04/27/2016     gabapentin (NEURONTIN) 300 MG capsule Take 300 mg by mouth 2 (two) times daily.     gabapentin (NEURONTIN) 300 MG capsule Take 1 capsule (300 mg total) by mouth two (2) times a day. 60 capsule 4   gabapentin (NEURONTIN) 300 MG capsule Take 1 capsule (300 mg total) by mouth two (2) times a day. 60 capsule 4   ibuprofen (ADVIL) 200 MG tablet Take 200 mg by mouth every 6 (six) hours as needed.     ketoconazole (NIZORAL) 2 % cream Apply 1 application topically once daily in the morning. 15 g 11   ketoconazole (NIZORAL) 2 % shampoo Apply topically to scalp twice a week. 120 mL 11   levothyroxine (SYNTHROID) 137 MCG tablet Take 137 mcg by mouth daily before breakfast.     levothyroxine (SYNTHROID) 137 MCG tablet TAKE ONE TABLET BY MOUTH EVERY DAY 90 tablet 0   metFORMIN (GLUCOPHAGE-XR) 500 MG 24 hr tablet Take 1,000 mg by mouth 2 (two) times daily.     metFORMIN (GLUCOPHAGE-XR) 500 MG 24 hr tablet TAKE 4 TABLETS BY MOUTH EVERY DAY WITH EVENING MEAL 120 tablet 30   metoprolol succinate (TOPROL-XL)  25 MG 24 hr tablet TAKE ONE TABLET BY MOUTH EVERY DAY 30 tablet 11   mometasone (ELOCON) 0.1 % ointment Apply topically as needed. 45 g 2   montelukast (SINGULAIR) 10 MG tablet Take 10 mg by mouth at bedtime.     montelukast (SINGULAIR) 10 MG tablet Take  1 tablet (10 mg total) by mouth daily. 30 tablet 11   omeprazole (PRILOSEC) 40 MG capsule Take 40 mg by mouth daily.     omeprazole (PRILOSEC) 40 MG capsule TAKE ONE CAPSULE BY MOUTH EVERY DAY 90 capsule 3   predniSONE (DELTASONE) 20 MG tablet TAKE TWO TABLETS BY MOUTH EVERY DAY FOR 5 DAYS 10 tablet 0   predniSONE (DELTASONE) 20 MG tablet Take 2 tablets (40 mg total) by mouth every morning for 5 days. 10 tablet 0   triamcinolone (KENALOG) 0.1 % Apply to rash twice daily as needed 80 g 2   triamcinolone cream (KENALOG) 0.1 % Apply 1 application topically as needed. 30 g 2   No facility-administered medications prior to visit.    Allergies  Allergen Reactions   Methotrexate Derivatives Anaphylaxis   Cefdinir    Chantix [Varenicline]     rash   Codeine     SOB, itching   Plaquenil [Hydroxychloroquine] Other (See Comments)    Hair loss and rash    ROS Review of Systems    Objective:    Physical Exam  There were no vitals taken for this visit. Wt Readings from Last 3 Encounters:  06/11/21 242 lb 4.8 oz (109.9 kg)  12/11/20 233 lb (105.7 kg)  12/13/19 255 lb 4.8 oz (115.8 kg)     Health Maintenance Due  Topic Date Due   Pneumococcal Vaccine 60-48 Years old (1 - PCV) Never done   FOOT EXAM  Never done   OPHTHALMOLOGY EXAM  Never done   URINE MICROALBUMIN  Never done   HIV Screening  Never done   Zoster Vaccines- Shingrix (1 of 2) Never done   PAP SMEAR-Modifier  Never done   COLONOSCOPY (Pts 45-75yr Insurance coverage will need to be confirmed)  Never done   MAMMOGRAM  Never done   HEMOGLOBIN A1C  06/10/2021    There are no preventive care reminders to display for this patient.  Lab Results  Component Value Date    TSH 0.645 06/11/2021   Lab Results  Component Value Date   WBC 7.2 06/11/2021   HGB 12.8 06/11/2021   HCT 40.3 06/11/2021   MCV 94 06/11/2021   PLT 355 06/11/2021   Lab Results  Component Value Date   NA 142 06/11/2021   K 3.9 06/11/2021   CO2 24 06/11/2021   GLUCOSE 91 06/11/2021   BUN 16 06/11/2021   CREATININE 0.86 06/11/2021   BILITOT 0.4 06/11/2021   ALKPHOS 95 06/11/2021   AST 16 06/11/2021   ALT 22 06/11/2021   PROT 6.9 06/11/2021   ALBUMIN 4.0 06/11/2021   CALCIUM 8.6 (L) 06/11/2021   ANIONGAP 6 04/18/2016   EGFR 76 06/11/2021   Lab Results  Component Value Date   CHOL 230 (H) 12/11/2020   Lab Results  Component Value Date   HDL 65 12/11/2020   Lab Results  Component Value Date   LDLCALC 137 (H) 12/11/2020   Lab Results  Component Value Date   TRIG 160 (H) 12/11/2020   Lab Results  Component Value Date   CHOLHDL 3.5 12/11/2020   Lab Results  Component Value Date   HGBA1C 5.6 12/11/2020      Assessment & Plan:    Pneumonia due to infectious organism, unspecified laterality, unspecified part of lung [facility] Started on doxycycline and had f/u with family medicine. Reports was feeling better until 41mprednisone daily ended yesterday. Since then she's been struggling, running  low grade fever, requiring her nebulizer more. Reports she is thinking about returning to Urgent Care dicussed options with the patient. Going to try short course of prednisone taper 17m over the next 6 days. If not improved she will return to Urgent Care.  2. COPD Her pneumonia has exacerbated her condition. See note above.  3. Hypothyroidism, unspecified type Lab tests show she is therapeutic with her medication  Follow-up: If condition stabilizes we will plan to see her in 6 months with appropriate labs   VCayce

## 2021-06-25 ENCOUNTER — Other Ambulatory Visit: Payer: Self-pay

## 2021-07-01 ENCOUNTER — Other Ambulatory Visit: Payer: Self-pay

## 2021-07-01 MED FILL — Albuterol Sulfate Inhal Aero 108 MCG/ACT (90MCG Base Equiv): RESPIRATORY_TRACT | 75 days supply | Qty: 25.5 | Fill #1 | Status: AC

## 2021-07-02 ENCOUNTER — Other Ambulatory Visit: Payer: Self-pay

## 2021-07-03 ENCOUNTER — Other Ambulatory Visit: Payer: Self-pay

## 2021-07-04 ENCOUNTER — Telehealth: Payer: Self-pay | Admitting: Pharmacist

## 2021-07-04 ENCOUNTER — Other Ambulatory Visit: Payer: Self-pay

## 2021-07-04 NOTE — Telephone Encounter (Signed)
07/04/2021 1:53:24 PM - Trelegy Ellipta refill online with [place] - Friday, July 04, 2021 1:51 PM --Placed refill online with [facility] for Trelegy Ellipta 100-62.5--rx# [id number], using patient's zipcode--they will ship to patient & she brings invoice to [place]. Allow 10-14 business days for patient to receive Order# [id number].

## 2021-07-08 ENCOUNTER — Other Ambulatory Visit: Payer: Self-pay

## 2021-07-08 MED ORDER — BACLOFEN 10 MG PO TABS
ORAL_TABLET | ORAL | 0 refills | Status: DC
  Filled 2021-07-08: qty 8, 5d supply, fill #0

## 2021-07-09 ENCOUNTER — Other Ambulatory Visit: Payer: Self-pay

## 2021-07-09 MED FILL — Metformin HCl Tab ER 24HR 500 MG: ORAL | 30 days supply | Qty: 120 | Fill #3 | Status: AC

## 2021-07-10 ENCOUNTER — Other Ambulatory Visit: Payer: Self-pay

## 2021-07-18 ENCOUNTER — Other Ambulatory Visit: Payer: Self-pay

## 2021-07-18 MED FILL — Omeprazole Cap Delayed Release 40 MG: ORAL | 90 days supply | Qty: 90 | Fill #1 | Status: AC

## 2021-07-21 ENCOUNTER — Telehealth: Payer: Self-pay | Admitting: Pharmacist

## 2021-07-21 NOTE — Telephone Encounter (Signed)
07/21/2021 8:34:50 AM - ProAir refill faxed to [facility] - Monday, July 21, 2021 8:34 AM -- Isabel Jimenez [employer] refill request for ProAir HFA.

## 2021-07-22 ENCOUNTER — Other Ambulatory Visit: Payer: Self-pay

## 2021-07-31 ENCOUNTER — Other Ambulatory Visit: Payer: Self-pay

## 2021-08-08 ENCOUNTER — Other Ambulatory Visit: Payer: Self-pay

## 2021-08-13 ENCOUNTER — Other Ambulatory Visit: Payer: Self-pay

## 2021-08-14 ENCOUNTER — Other Ambulatory Visit: Payer: Self-pay

## 2021-08-15 ENCOUNTER — Other Ambulatory Visit: Payer: Self-pay

## 2021-08-15 MED FILL — Metformin HCl Tab ER 24HR 500 MG: ORAL | 30 days supply | Qty: 120 | Fill #4 | Status: AC

## 2021-08-18 ENCOUNTER — Other Ambulatory Visit: Payer: Self-pay

## 2021-08-21 ENCOUNTER — Other Ambulatory Visit: Payer: Self-pay

## 2021-08-21 MED ORDER — MONTELUKAST SODIUM 10 MG PO TABS
10.0000 mg | ORAL_TABLET | Freq: Every day | ORAL | 5 refills | Status: DC
  Filled 2021-09-06: qty 30, 30d supply, fill #0
  Filled 2021-10-12: qty 30, 30d supply, fill #1
  Filled 2021-11-15: qty 30, 30d supply, fill #2
  Filled 2021-12-13: qty 30, 30d supply, fill #3
  Filled 2022-01-03 – 2022-01-09 (×2): qty 30, 30d supply, fill #4
  Filled 2022-02-12: qty 30, 30d supply, fill #5

## 2021-09-06 ENCOUNTER — Other Ambulatory Visit: Payer: Self-pay | Admitting: Internal Medicine

## 2021-09-06 DIAGNOSIS — E039 Hypothyroidism, unspecified: Secondary | ICD-10-CM

## 2021-09-07 MED ORDER — LEVOTHYROXINE SODIUM 137 MCG PO TABS
ORAL_TABLET | Freq: Every day | ORAL | 0 refills | Status: DC
  Filled 2021-09-07: qty 90, fill #0
  Filled 2021-09-17 – 2021-09-19 (×2): qty 90, 90d supply, fill #0

## 2021-09-08 ENCOUNTER — Other Ambulatory Visit: Payer: Self-pay

## 2021-09-11 ENCOUNTER — Other Ambulatory Visit: Payer: Self-pay

## 2021-09-17 ENCOUNTER — Other Ambulatory Visit: Payer: Self-pay

## 2021-09-18 ENCOUNTER — Other Ambulatory Visit: Payer: Self-pay

## 2021-09-18 MED ORDER — BACLOFEN 10 MG PO TABS
ORAL_TABLET | ORAL | 0 refills | Status: DC
  Filled 2021-09-18: qty 42, 14d supply, fill #0

## 2021-09-19 ENCOUNTER — Other Ambulatory Visit: Payer: Self-pay

## 2021-09-22 ENCOUNTER — Other Ambulatory Visit: Payer: Self-pay

## 2021-09-25 ENCOUNTER — Other Ambulatory Visit: Payer: Self-pay

## 2021-09-26 ENCOUNTER — Other Ambulatory Visit: Payer: Self-pay

## 2021-09-28 ENCOUNTER — Other Ambulatory Visit: Payer: Self-pay | Admitting: Internal Medicine

## 2021-09-29 ENCOUNTER — Other Ambulatory Visit: Payer: Self-pay

## 2021-09-29 MED ORDER — AZATHIOPRINE 50 MG PO TABS
ORAL_TABLET | ORAL | 0 refills | Status: DC
  Filled 2021-09-29: qty 180, 90d supply, fill #0

## 2021-09-30 ENCOUNTER — Other Ambulatory Visit: Payer: Self-pay

## 2021-09-30 MED ORDER — TRELEGY ELLIPTA 100-62.5-25 MCG/ACT IN AEPB
INHALATION_SPRAY | RESPIRATORY_TRACT | 3 refills | Status: DC
  Filled 2021-11-01: qty 30, 30d supply, fill #0

## 2021-10-02 ENCOUNTER — Other Ambulatory Visit: Payer: Self-pay

## 2021-10-02 MED FILL — Albuterol Sulfate Inhal Aero 108 MCG/ACT (90MCG Base Equiv): RESPIRATORY_TRACT | 75 days supply | Qty: 25.5 | Fill #2 | Status: AC

## 2021-10-13 ENCOUNTER — Other Ambulatory Visit: Payer: Self-pay

## 2021-10-22 ENCOUNTER — Other Ambulatory Visit: Payer: Self-pay

## 2021-10-24 ENCOUNTER — Other Ambulatory Visit: Payer: Self-pay

## 2021-10-24 ENCOUNTER — Other Ambulatory Visit: Payer: Self-pay | Admitting: Internal Medicine

## 2021-10-24 MED ORDER — OMEPRAZOLE 40 MG PO CPDR
40.0000 mg | DELAYED_RELEASE_CAPSULE | Freq: Every day | ORAL | 3 refills | Status: DC
  Filled 2021-10-24: qty 90, 90d supply, fill #0
  Filled 2022-01-22: qty 90, 90d supply, fill #1
  Filled 2022-04-17: qty 90, 90d supply, fill #2

## 2021-10-27 ENCOUNTER — Other Ambulatory Visit: Payer: Self-pay

## 2021-10-29 ENCOUNTER — Other Ambulatory Visit: Payer: Self-pay

## 2021-11-03 ENCOUNTER — Other Ambulatory Visit: Payer: Self-pay

## 2021-11-04 ENCOUNTER — Other Ambulatory Visit: Payer: Self-pay

## 2021-11-04 MED ORDER — AZITHROMYCIN 500 MG PO TABS
ORAL_TABLET | ORAL | 0 refills | Status: DC
Start: 2021-11-04 — End: 2022-03-25
  Filled 2021-11-04: qty 3, 3d supply, fill #0

## 2021-11-05 ENCOUNTER — Encounter: Payer: Self-pay | Admitting: Pharmacist

## 2021-11-05 ENCOUNTER — Other Ambulatory Visit: Payer: Self-pay

## 2021-11-05 ENCOUNTER — Ambulatory Visit: Payer: Self-pay | Admitting: Pharmacist

## 2021-11-05 DIAGNOSIS — Z79899 Other long term (current) drug therapy: Secondary | ICD-10-CM

## 2021-11-05 NOTE — Progress Notes (Signed)
[facility]ication Management Clinic Visit Note  Patient: Isabel Jimenez MRN: [id number] Date of Birth: [birth year] PCP: Tawni Millers, MD   Isabel Jimenez 60 y.o. female presents for a MTM visit today via phone. Confirmed patient's identity by successfully verifying 2 forms of identification being full name and date of birth.  There were no vitals taken for this visit.  Patient Information   Past Medical History:  Diagnosis Date   Asthma    Cancer ([place])    skin   COPD (chronic obstructive pulmonary disease) (HCC)    GERD (gastroesophageal reflux disease)    Hyperlipidemia    Hypertension    Joint pain    Lupus (HCC)    Migraines    Non-alcoholic fatty liver disease    Osteoarthritis    Thyroid disease       Past Surgical History:  Procedure Laterality Date   SQUAMOUS CELL CARCINOMA EXCISION     Left Shoulder   TRIGGER FINGER RELEASE Right    TUBAL LIGATION       Family History  Problem Relation Age of Onset   Hypertension Mother    Cancer Mother        Breast   Hypothyroidism Mother    Heart disease Father    Cancer Father        Colon   Renal Disease Father    Hashimoto's thyroiditis Sister    Cancer Sister    Diabetes Sister    Cancer Brother    Hypothyroidism Brother    Hyperlipidemia Brother    Hypertension Brother    Osteoarthritis Brother    ADD / ADHD Son     New Diagnoses (since last visit): N/A  Family Support: Good  Lifestyle Diet: Breakfast:Egg sandwich or cereal. Lunch:Snack food such as cheese, oranges, or bananas. Dinner:Meats, vegetables, and some carbs. Drinks:Water.            Social History   Substance and Sexual Activity  Alcohol Use No   Alcohol/week: 0.0 standard drinks      Social History   Tobacco Use  Smoking Status Former   Packs/day: 0.50   Years: 45.00   Pack years: 22.50   Types: Cigarettes  Smokeless Tobacco Never  Tobacco Comments   last cig 04/26/16      Health Maintenance  Topic Date Due    Pneumococcal Vaccine 40-50 Years old (1 - PCV) Never done   FOOT EXAM  Never done   OPHTHALMOLOGY EXAM  Never done   URINE MICROALBUMIN  Never done   HIV Screening  Never done   Zoster Vaccines- Shingrix (1 of 2) Never done   PAP SMEAR-Modifier  Never done   COLONOSCOPY (Pts 45-5yrs Insurance coverage will need to be confirmed)  Never done   MAMMOGRAM  Never done   HEMOGLOBIN A1C  06/10/2021   TETANUS/TDAP  05/14/2025   INFLUENZA VACCINE  Completed   COVID-19 Vaccine  Completed   Hepatitis C Screening  Completed   HPV VACCINES  Aged Out   Health Maintenance/Date Completed  Last ED visit: 10/24/21 Last Visit to PCP: 06/18/21 Next Visit to PCP: 12/2021 Specialist Visit: 11/11/21 Dental Exam: 05/2021 Eye Exam: 05/2020 Prostate Exam: N/A Pelvic/PAP Exam: N/A Mammogram: 11/12/21 DEXA: N/A/ Colonoscopy: N/A Flu Vaccine: 09/2021 Pneumonia Vaccine: PNEUMOVAX 23 07/20/18 COVID-19 Vaccine: COMPLETED LAST BOOSTER DOSE 09/10/21 Shingrix Vaccine: N/A  Outpatient Encounter Medications as of 11/05/2021  Medication Sig   albuterol (PROVENTIL) (2.5 MG/3ML) 0.083% nebulizer solution INHALE CONTENTS OF ONE VIAL (  3ML) ONCE EVERY 6 HOURS USING NEBULIZER AS NEEDED FOR WHEEZING OR SHORTNESS OF BREATH.   albuterol (VENTOLIN HFA) 108 (90 Base) MCG/ACT inhaler INHALE 2 PUFFS EVERY 6 HOURS AS NEEDED FOR WHEEZING   atorvastatin (LIPITOR) 40 MG tablet TAKE ONE TABLET BY MOUTH ONCE EVERY DAY.   azaTHIOprine (IMURAN) 50 MG tablet Take 2 tablets (100 mg total) by mouth once daily.   azithromycin (ZITHROMAX) 500 MG tablet Take 1 tablet (500 mg total) by mouth once daily for 3 days.   baclofen (LIORESAL) 10 MG tablet Take 1 tablet (10 mg total) by mouth three (3) times daily for 14 days.   docusate sodium (COLACE) 100 MG capsule Take 300 mg by mouth daily.   fluticasone (FLONASE) 50 MCG/ACT nasal spray SPRAY ONE SPRAY INTO EACH NOSTRIL ONCE DAILY.   Fluticasone-Umeclidin-Vilant (TRELEGY ELLIPTA) 100-62.5-25  MCG/ACT AEPB INHALE ONE PUFF INTO THE LUNGS ONCE DAILY.   gabapentin (NEURONTIN) 300 MG capsule Take 1 capsule (300 mg total) by mouth two (2) times a day.   ibuprofen (ADVIL) 200 MG tablet Take 200 mg by mouth every 6 (six) hours as needed.   ketoconazole (NIZORAL) 2 % cream Apply 1 application topically once daily in the morning.   ketoconazole (NIZORAL) 2 % shampoo Apply one application topically to scalp twice a week.   levothyroxine (SYNTHROID) 137 MCG tablet TAKE ONE TABLET BY MOUTH ONCE EVERY DAY.   montelukast (SINGULAIR) 10 MG tablet Take 1 tablet (10 mg total) by mouth once daily.   omeprazole (PRILOSEC) 40 MG capsule Take 1 capsule (40 mg total) by mouth once daily.   triamcinolone cream (KENALOG) 0.1 % Apply one application topically to rash twice daily as needed.   cyclobenzaprine (FLEXERIL) 5 MG tablet Take 1 tablet (5 mg total) by mouth two (2) times a day as needed for muscle spasms. (Patient not taking: Reported on 11/05/2021)   [DISCONTINUED] albuterol (PROVENTIL HFA;VENTOLIN HFA) 108 (90 Base) MCG/ACT inhaler Inhale 1-2 puffs into the lungs every 4 (four) hours as needed for wheezing or shortness of breath.   [DISCONTINUED] albuterol (PROVENTIL) (2.5 MG/3ML) 0.083% nebulizer solution Take 2.5 mg by nebulization every 6 (six) hours as needed for wheezing or shortness of breath.   [DISCONTINUED] amLODipine (NORVASC) 5 MG tablet Take 5 mg by mouth daily.   [DISCONTINUED] amLODipine (NORVASC) 5 MG tablet TAKE ONE TABLET BY MOUTH EVERY DAY   [DISCONTINUED] azaTHIOprine (IMURAN) 50 MG tablet Take 50 mg by mouth 2 (two) times daily.   [DISCONTINUED] cyclobenzaprine (FLEXERIL) 5 MG tablet Take 5 mg by mouth 2 (two) times daily as needed for muscle spasms.   [DISCONTINUED] cyclobenzaprine (FLEXERIL) 5 MG tablet TAKE ONE TABLET BY MOUTH 2 TIMES A DAY AS NEEDED FOR MUSCLE SPASMS   [DISCONTINUED] diltiazem (CARDIZEM CD) 120 MG 24 hr capsule TAKE ONE CAPSULE BY MOUTH EVERY DAY    [DISCONTINUED] doxycycline (VIBRAMYCIN) 100 MG capsule Take 1 capsule (100 mg total) by mouth Two (2) times a day for 7 days.   [DISCONTINUED] fluticasone (FLONASE) 50 MCG/ACT nasal spray USE 1 SPRAY IN EACH NOSTRIL DAILY   [DISCONTINUED] Fluticasone-Umeclidin-Vilant (TRELEGY ELLIPTA) 100-62.5-25 MCG/INH AEPB Inhale into the lungs once.    [DISCONTINUED] folic acid (FOLVITE) 1 MG tablet Take 1 mg by mouth daily. Reported on 04/27/2016   [DISCONTINUED] gabapentin (NEURONTIN) 300 MG capsule Take 300 mg by mouth 2 (two) times daily.   [DISCONTINUED] metFORMIN (GLUCOPHAGE-XR) 500 MG 24 hr tablet Take 1,000 mg by mouth 2 (two) times daily.   [  DISCONTINUED] metFORMIN (GLUCOPHAGE-XR) 500 MG 24 hr tablet TAKE 4 TABLETS BY MOUTH EVERY DAY WITH EVENING MEAL   [DISCONTINUED] metoprolol succinate (TOPROL-XL) 25 MG 24 hr tablet TAKE ONE TABLET BY MOUTH EVERY DAY   [DISCONTINUED] mometasone (ELOCON) 0.1 % ointment Apply topically as needed.   [DISCONTINUED] montelukast (SINGULAIR) 10 MG tablet Take 10 mg by mouth at bedtime.   [DISCONTINUED] omeprazole (PRILOSEC) 40 MG capsule Take 40 mg by mouth daily.   [DISCONTINUED] triamcinolone cream (KENALOG) 0.1 % Apply 1 application topically as needed.   No facility-administered encounter medications on file as of 11/05/2021.     Assessment and Plan:  Shauniece Kwan presents to the clinic today via phone for an MTM visit. Patient was in good spirits today after recovering from the flu on 11/18 and a COPD exacerbation accompanied by an infection. Reviewed and updated patient's medication list, history, immunizations and allergies.  Asthma/COPD: Patient recovering from an exacerbation accompanied by an infection. Pt prescribed 3 day course of azithromycin 500mg  and albuterol. Counseled her to complete antibiotic course. Osteoarthritis: Patient reports having trigger finger surgery on right thumb and is expected to have surgery on both hands soon. Pt reports taking  ibuprofen only as needed. Pt reports having a follow-up appointment with Dr. Elodia Florence next month. Lupus: Patient reports taking Imuran however, is currently not taking due to current infection. Pt plans to restart therapy once the antibiotic course is completed. Hypothyroidism: Patient reports being compliant and stable taking levothyroxine 130mcg daily. Hyperlipidemia: Patient reports taking atorvastatin 40mg  daily. Pt reports eating a diet consisting of fruits and vegetables and planning to exercise in the future. Encouraged her to continue a healthy diet rich in fruits and vegetables and to regularly exercise once she has recovered from her exacerbation. GERD: Patient reports being compliant and stable on omeprazole daily. Hypertension: Patient reports no longer taking any blood pressure medications due to how the side effects of these drugs makes her feel but continues to monitor. Diabetes: Patient reports not taking any medications for this and is unclear about the potential status of this diagnosis.  Current plan is for Ruari to continue a healthy diet of fruits and vegetables and to implement a stable exercise regimen once she recovers from the exacerbation. Counseled patient to continue compliance on all of her medications and upcoming appointments. Encouraged her to pursue receiving the Shingles immunization and to follow up with her primary care physician. Expected return to clinic in 1 year.  Sinclair Ship, PharmD Medication Management Clinic Phone: [phone]

## 2021-11-06 ENCOUNTER — Other Ambulatory Visit: Payer: Self-pay

## 2021-11-06 MED ORDER — TRELEGY ELLIPTA 100-62.5-25 MCG/ACT IN AEPB
INHALATION_SPRAY | RESPIRATORY_TRACT | 3 refills | Status: DC
  Filled 2021-12-20: qty 30, fill #0

## 2021-11-17 ENCOUNTER — Other Ambulatory Visit: Payer: Self-pay

## 2021-11-18 ENCOUNTER — Other Ambulatory Visit: Payer: Self-pay

## 2021-11-24 ENCOUNTER — Other Ambulatory Visit: Payer: Self-pay

## 2021-12-10 ENCOUNTER — Other Ambulatory Visit: Payer: Self-pay

## 2021-12-10 DIAGNOSIS — E039 Hypothyroidism, unspecified: Secondary | ICD-10-CM

## 2021-12-11 LAB — URINALYSIS, ROUTINE W REFLEX MICROSCOPIC
Bilirubin, UA: NEGATIVE
Glucose, UA: NEGATIVE
Ketones, UA: NEGATIVE
Leukocytes,UA: NEGATIVE
Nitrite, UA: NEGATIVE
Protein,UA: NEGATIVE
RBC, UA: NEGATIVE
Specific Gravity, UA: 1.007 (ref 1.005–1.030)
Urobilinogen, Ur: 0.2 mg/dL (ref 0.2–1.0)
pH, UA: 6 (ref 5.0–7.5)

## 2021-12-11 LAB — CBC WITH DIFFERENTIAL/PLATELET
Basophils Absolute: 0 10*3/uL (ref 0.0–0.2)
Basos: 0 %
EOS (ABSOLUTE): 0 10*3/uL (ref 0.0–0.4)
Eos: 0 %
Hematocrit: 36.2 % (ref 34.0–46.6)
Hemoglobin: 12.4 g/dL (ref 11.1–15.9)
Immature Grans (Abs): 0 10*3/uL (ref 0.0–0.1)
Immature Granulocytes: 0 %
Lymphocytes Absolute: 1.5 10*3/uL (ref 0.7–3.1)
Lymphs: 21 %
MCH: 30.6 pg (ref 26.6–33.0)
MCHC: 34.3 g/dL (ref 31.5–35.7)
MCV: 89 fL (ref 79–97)
Monocytes Absolute: 0.6 10*3/uL (ref 0.1–0.9)
Monocytes: 9 %
Neutrophils Absolute: 4.9 10*3/uL (ref 1.4–7.0)
Neutrophils: 70 %
Platelets: 331 10*3/uL (ref 150–450)
RBC: 4.05 x10E6/uL (ref 3.77–5.28)
RDW: 14.2 % (ref 11.7–15.4)
WBC: 7 10*3/uL (ref 3.4–10.8)

## 2021-12-11 LAB — COMPREHENSIVE METABOLIC PANEL
ALT: 23 IU/L (ref 0–32)
AST: 16 IU/L (ref 0–40)
Albumin/Globulin Ratio: 1.4 (ref 1.2–2.2)
Albumin: 3.9 g/dL (ref 3.8–4.8)
Alkaline Phosphatase: 107 IU/L (ref 44–121)
BUN/Creatinine Ratio: 18 (ref 12–28)
BUN: 13 mg/dL (ref 8–27)
Bilirubin Total: <0.2 mg/dL (ref 0.0–1.2)
CO2: 24 mmol/L (ref 20–29)
Calcium: 9 mg/dL (ref 8.7–10.3)
Chloride: 105 mmol/L (ref 96–106)
Creatinine, Ser: 0.72 mg/dL (ref 0.57–1.00)
Globulin, Total: 2.7 g/dL (ref 1.5–4.5)
Glucose: 139 mg/dL — ABNORMAL HIGH (ref 70–99)
Potassium: 4.5 mmol/L (ref 3.5–5.2)
Sodium: 144 mmol/L (ref 134–144)
Total Protein: 6.6 g/dL (ref 6.0–8.5)
eGFR: 94 mL/min/{1.73_m2} (ref >59)

## 2021-12-11 LAB — LIPID PANEL WITH LDL/HDL RATIO
Cholesterol, Total: 173 mg/dL (ref 100–199)
HDL: 57 mg/dL (ref >39)
LDL Chol Calc (NIH): 90 mg/dL (ref 0–99)
LDL/HDL Ratio: 1.6 ratio (ref 0.0–3.2)
Triglycerides: 152 mg/dL — ABNORMAL HIGH (ref 0–149)
VLDL Cholesterol Cal: 26 mg/dL (ref 5–40)

## 2021-12-11 LAB — HEMOGLOBIN A1C
Est. average glucose Bld gHb Est-mCnc: 126 mg/dL
Hgb A1c MFr Bld: 6 % — ABNORMAL HIGH (ref 4.8–5.6)

## 2021-12-11 LAB — TSH: TSH: 0.08 u[IU]/mL — ABNORMAL LOW (ref 0.450–4.500)

## 2021-12-15 ENCOUNTER — Other Ambulatory Visit: Payer: Self-pay

## 2021-12-17 ENCOUNTER — Encounter: Payer: Self-pay | Admitting: Internal Medicine

## 2021-12-17 ENCOUNTER — Ambulatory Visit: Payer: Self-pay | Admitting: Internal Medicine

## 2021-12-17 DIAGNOSIS — E039 Hypothyroidism, unspecified: Secondary | ICD-10-CM

## 2021-12-17 DIAGNOSIS — J449 Chronic obstructive pulmonary disease, unspecified: Secondary | ICD-10-CM

## 2021-12-17 DIAGNOSIS — J45909 Unspecified asthma, uncomplicated: Secondary | ICD-10-CM

## 2021-12-17 DIAGNOSIS — J209 Acute bronchitis, unspecified: Secondary | ICD-10-CM

## 2021-12-17 NOTE — Progress Notes (Signed)
Established Patient Office Visit  Subjective:  Patient ID: Isabel Jimenez, female    DOB: Apr 05, 1961  Age: 61 y.o. MRN: [id number]  CC:  Chief Complaint  Patient presents with   Follow-up    HPI Isabel Jimenez is a 61 y/o female who presents for routine follow up with her labs drawn 12/10/2021. Pt reports she had Influenza A on November and was admitted to the hospital. Pt expressed concern for her TSH levels from her lab work. Assured patient since she is feeling well we will recheck her in April. Pt reports she has not needed to use her inhaler often for her bronchitis and COPD, aside from occasional exacerbations.   Past Medical History:  Diagnosis Date   Asthma    Cancer ([place])    skin   COPD (chronic obstructive pulmonary disease) (HCC)    GERD (gastroesophageal reflux disease)    Hyperlipidemia    Hypertension    Joint pain    Lupus ([place])    Migraines    Non-alcoholic fatty liver disease    Osteoarthritis    Thyroid disease     Past Surgical History:  Procedure Laterality Date   SQUAMOUS CELL CARCINOMA EXCISION     Left Shoulder   TRIGGER FINGER RELEASE Right    TUBAL LIGATION      Family History  Problem Relation Age of Onset   Hypertension Mother    Cancer Mother        Breast   Hypothyroidism Mother    Heart disease Father    Cancer Father        Colon   Renal Disease Father    Hashimoto's thyroiditis Sister    Cancer Sister    Diabetes Sister    Cancer Brother    Hypothyroidism Brother    Hyperlipidemia Brother    Hypertension Brother    Osteoarthritis Brother    ADD / ADHD Son     Social History   Socioeconomic History   Marital status: Married    Spouse name: Not on file   Number of children: Not on file   Years of education: Not on file   Highest education level: Not on file  Occupational History   Not on file  Tobacco Use   Smoking status: Former    Packs/day: 0.50    Years: 45.00    Pack years: 22.50    Types: Cigarettes    Smokeless tobacco: Never   Tobacco comments:    last cig 04/26/16  Vaping Use   Vaping Use: Never used  Substance and Sexual Activity   Alcohol use: No    Alcohol/week: 0.0 standard drinks   Drug use: No   Sexual activity: Not on file  Other Topics Concern   Not on file  Social History Narrative   Not on file   Social Determinants of Health   Financial Resource Strain: Not on file  Food Insecurity: Not on file  Transportation Needs: Not on file  Physical Activity: Not on file  Stress: Not on file  Social Connections: Not on file  Intimate Partner Violence: Not on file    Outpatient Medications Prior to Visit  Medication Sig Dispense Refill   albuterol (PROVENTIL) (2.5 MG/3ML) 0.083% nebulizer solution INHALE CONTENTS OF ONE VIAL (3ML) ONCE EVERY 6 HOURS USING NEBULIZER AS NEEDED FOR WHEEZING OR SHORTNESS OF BREATH. 300 mL 3   albuterol (VENTOLIN HFA) 108 (90 Base) MCG/ACT inhaler INHALE 2 PUFFS EVERY 6 HOURS AS  NEEDED FOR WHEEZING 18 g 11   atorvastatin (LIPITOR) 40 MG tablet TAKE ONE TABLET BY MOUTH ONCE EVERY DAY. 90 tablet 3   azaTHIOprine (IMURAN) 50 MG tablet Take 2 tablets (100 mg total) by mouth once daily. 180 tablet 0   azithromycin (ZITHROMAX) 500 MG tablet Take 1 tablet (500 mg total) by mouth once daily for 3 days. 3 tablet 0   baclofen (LIORESAL) 10 MG tablet Take 1 tablet (10 mg total) by mouth three (3) times daily for 14 days. 42 tablet 0   cyclobenzaprine (FLEXERIL) 5 MG tablet Take 1 tablet (5 mg total) by mouth two (2) times a day as needed for muscle spasms. (Patient not taking: Reported on 11/05/2021) 60 tablet 1   docusate sodium (COLACE) 100 MG capsule Take 300 mg by mouth daily.     fluticasone (FLONASE) 50 MCG/ACT nasal spray SPRAY ONE SPRAY INTO EACH NOSTRIL ONCE DAILY. 16 g 11   Fluticasone-Umeclidin-Vilant (TRELEGY ELLIPTA) 100-62.5-25 MCG/ACT AEPB INHALE ONE PUFF INTO THE LUNGS ONCE DAILY. 30 each 3   gabapentin (NEURONTIN) 300 MG capsule Take 1  capsule (300 mg total) by mouth two (2) times a day. 60 capsule 4   ibuprofen (ADVIL) 200 MG tablet Take 200 mg by mouth every 6 (six) hours as needed.     ketoconazole (NIZORAL) 2 % cream Apply 1 application topically once daily in the morning. 15 g 11   ketoconazole (NIZORAL) 2 % shampoo Apply one application topically to scalp twice a week. 120 mL 11   levothyroxine (SYNTHROID) 137 MCG tablet TAKE ONE TABLET BY MOUTH ONCE EVERY DAY. 90 tablet 0   montelukast (SINGULAIR) 10 MG tablet Take 1 tablet (10 mg total) by mouth once daily. 30 tablet 5   omeprazole (PRILOSEC) 40 MG capsule Take 1 capsule (40 mg total) by mouth once daily. 90 capsule 3   triamcinolone cream (KENALOG) 0.1 % Apply one application topically to rash twice daily as needed. 80 g 2   No facility-administered medications prior to visit.    Allergies  Allergen Reactions   Methotrexate Derivatives Anaphylaxis   Cefdinir Swelling    Throat swells.   Codeine     SOB, itching   Plaquenil [Hydroxychloroquine] Other (See Comments)    Hair loss and rash   Chantix [Varenicline] Rash    rash    ROS Review of Systems    Objective:    Physical Exam  There were no vitals taken for this visit. Wt Readings from Last 3 Encounters:  12/10/21 249 lb 12.8 oz (113.3 kg)  06/11/21 242 lb 4.8 oz (109.9 kg)  12/11/20 233 lb (105.7 kg)     Health Maintenance Due  Topic Date Due   Pneumococcal Vaccine 46-57 Years old (1 - PCV) Never done   FOOT EXAM  Never done   OPHTHALMOLOGY EXAM  Never done   URINE MICROALBUMIN  Never done   HIV Screening  Never done   Zoster Vaccines- Shingrix (1 of 2) Never done   PAP SMEAR-Modifier  Never done   COLONOSCOPY (Pts 45-26yr Insurance coverage will need to be confirmed)  Never done   MAMMOGRAM  Never done    There are no preventive care reminders to display for this patient.  Lab Results  Component Value Date   TSH 0.080 (L) 12/10/2021   Lab Results  Component Value Date   WBC  7.0 12/10/2021   HGB 12.4 12/10/2021   HCT 36.2 12/10/2021   MCV 89 12/10/2021  PLT 331 12/10/2021   Lab Results  Component Value Date   NA 144 12/10/2021   K 4.5 12/10/2021   CO2 24 12/10/2021   GLUCOSE 139 (H) 12/10/2021   BUN 13 12/10/2021   CREATININE 0.72 12/10/2021   BILITOT <0.2 12/10/2021   ALKPHOS 107 12/10/2021   AST 16 12/10/2021   ALT 23 12/10/2021   PROT 6.6 12/10/2021   ALBUMIN 3.9 12/10/2021   CALCIUM 9.0 12/10/2021   ANIONGAP 6 04/18/2016   EGFR 94 12/10/2021   Lab Results  Component Value Date   CHOL 173 12/10/2021   Lab Results  Component Value Date   HDL 57 12/10/2021   Lab Results  Component Value Date   LDLCALC 90 12/10/2021   Lab Results  Component Value Date   TRIG 152 (H) 12/10/2021   Lab Results  Component Value Date   CHOLHDL 3.5 12/11/2020   Lab Results  Component Value Date   HGBA1C 6.0 (H) 12/10/2021      Assessment & Plan:   Problem List Items Addressed This Visit       Respiratory   COPD (chronic obstructive pulmonary disease) ([place])   Asthma   Acute bronchitis - Primary     Endocrine   Hypothyroidism    1. Hypothyroidism, unspecified type Clinical history of hypothyroid. Has been on medication for some time. Routine lab shows TSH has dropped below normal range. Appears to be asymptomatic. Will plan to repeat thyroid test in 90 days will follow up to confirm its status.   2. Asthma Had influenza/pneumonia In November. Treated by [facility] with aggressive outpatient support system with clearance of her symptoms. Since then her asthma and COPD has been stable without any new flares.   Other problems have been stable.    Follow-up:  Plan follow-up thyroid and lab testing in 34month    GDede Query CMA

## 2021-12-20 ENCOUNTER — Other Ambulatory Visit: Payer: Self-pay | Admitting: Gerontology

## 2021-12-20 DIAGNOSIS — E039 Hypothyroidism, unspecified: Secondary | ICD-10-CM

## 2021-12-22 ENCOUNTER — Other Ambulatory Visit: Payer: Self-pay

## 2021-12-23 ENCOUNTER — Other Ambulatory Visit: Payer: Self-pay

## 2021-12-23 ENCOUNTER — Other Ambulatory Visit: Payer: Self-pay | Admitting: Gerontology

## 2021-12-23 DIAGNOSIS — E039 Hypothyroidism, unspecified: Secondary | ICD-10-CM

## 2021-12-23 MED FILL — Levothyroxine Sodium Tab 137 MCG: ORAL | 90 days supply | Qty: 90 | Fill #0 | Status: AC

## 2021-12-24 ENCOUNTER — Other Ambulatory Visit: Payer: Self-pay

## 2021-12-30 ENCOUNTER — Other Ambulatory Visit: Payer: Self-pay

## 2021-12-30 MED ORDER — TRELEGY ELLIPTA 100-62.5-25 MCG/ACT IN AEPB
1.0000 | INHALATION_SPRAY | Freq: Every day | RESPIRATORY_TRACT | 3 refills | Status: DC
  Filled 2022-02-23: qty 180, 90d supply, fill #0
  Filled 2022-05-14: qty 180, 90d supply, fill #1
  Filled 2022-05-15: qty 60, 30d supply, fill #0

## 2022-01-01 ENCOUNTER — Other Ambulatory Visit: Payer: Self-pay

## 2022-01-01 MED ORDER — ALBUTEROL SULFATE HFA 108 (90 BASE) MCG/ACT IN AERS
INHALATION_SPRAY | RESPIRATORY_TRACT | 11 refills | Status: DC
  Filled 2022-01-01: qty 25.5, 75d supply, fill #0
  Filled 2022-03-16: qty 25.5, 75d supply, fill #1
  Filled 2022-05-14: qty 25.5, 75d supply, fill #2
  Filled 2022-05-15: qty 6.7, 25d supply, fill #0

## 2022-01-01 MED FILL — Albuterol Sulfate Inhal Aero 108 MCG/ACT (90MCG Base Equiv): RESPIRATORY_TRACT | 75 days supply | Qty: 25.5 | Fill #3 | Status: CN

## 2022-01-03 ENCOUNTER — Other Ambulatory Visit: Payer: Self-pay

## 2022-01-05 ENCOUNTER — Other Ambulatory Visit: Payer: Self-pay

## 2022-01-06 ENCOUNTER — Other Ambulatory Visit: Payer: Self-pay

## 2022-01-06 MED ORDER — AZATHIOPRINE 50 MG PO TABS
ORAL_TABLET | ORAL | 0 refills | Status: DC
  Filled 2022-01-06: qty 180, 90d supply, fill #0

## 2022-01-09 ENCOUNTER — Other Ambulatory Visit: Payer: Self-pay

## 2022-01-12 ENCOUNTER — Other Ambulatory Visit: Payer: Self-pay

## 2022-01-19 ENCOUNTER — Other Ambulatory Visit: Payer: Self-pay

## 2022-01-19 MED ORDER — AZATHIOPRINE 50 MG PO TABS
ORAL_TABLET | ORAL | 1 refills | Status: DC
  Filled 2022-02-12 – 2022-04-30 (×2): qty 180, fill #0
  Filled 2022-04-30: qty 180, 90d supply, fill #0

## 2022-01-22 ENCOUNTER — Other Ambulatory Visit: Payer: Self-pay

## 2022-02-03 ENCOUNTER — Telehealth: Payer: Self-pay | Admitting: Pharmacist

## 2022-02-03 NOTE — Telephone Encounter (Signed)
02/03/2022 12:31:06 PM - Trelegy renewal to pt & dr -- Arletha Pili - Tuesday, February 03, 2022 12:25 PM -- Printed renewal for Trelegy 100/1-62.5. Patient will pick up and carry to Dr. Kathryne Sharper @ [facility] for signature. Also pt will have her part to sign. Left this hanging on the wall with [id number] recertification app requesting POI & taxes.

## 2022-02-04 ENCOUNTER — Ambulatory Visit: Payer: Self-pay | Admitting: Pharmacy Technician

## 2022-02-04 ENCOUNTER — Other Ambulatory Visit: Payer: Self-pay

## 2022-02-04 DIAGNOSIS — Z79899 Other long term (current) drug therapy: Secondary | ICD-10-CM

## 2022-02-05 ENCOUNTER — Telehealth: Payer: Self-pay | Admitting: Pharmacy Technician

## 2022-02-05 ENCOUNTER — Other Ambulatory Visit: Payer: Self-pay

## 2022-02-05 NOTE — Progress Notes (Signed)
Assisted patient with the completion of paperwork for re-certification at [facility]. ? ?Jacquelynn Cree ?Care Manager ?[facility] ?

## 2022-02-05 NOTE — Telephone Encounter (Signed)
Received updated proof of income.  Patient eligible to receive medication assistance at [facility] until time for re-certification in 9892, and as long as eligibility requirements continue to be met. ? ?Jacquelynn Cree ?Care Manager ?[facility]  ?

## 2022-02-11 ENCOUNTER — Telehealth: Payer: Self-pay | Admitting: Pharmacist

## 2022-02-11 NOTE — Telephone Encounter (Signed)
02/11/2022 11:46:33 AM - Trelegy faxed to [place] ?-- Arletha Pili - Wednesday, February 11, 2022 11:45 AM -- ?Faxed renewal to [place] for Trelegy 100-62.5 ?

## 2022-02-12 ENCOUNTER — Other Ambulatory Visit: Payer: Self-pay

## 2022-02-13 ENCOUNTER — Other Ambulatory Visit: Payer: Self-pay

## 2022-02-16 ENCOUNTER — Other Ambulatory Visit: Payer: Self-pay

## 2022-02-23 ENCOUNTER — Other Ambulatory Visit: Payer: Self-pay

## 2022-03-02 ENCOUNTER — Other Ambulatory Visit: Payer: Self-pay

## 2022-03-02 ENCOUNTER — Telehealth: Payer: Self-pay | Admitting: Pharmacist

## 2022-03-02 NOTE — Telephone Encounter (Signed)
03/02/2022 3:01:14 PM - ProAir Prescription requested from dr ?-- Arletha Pili - Monday, March 02, 2022 2:57 PM -- ?[employer] extended enrollment for ProAir until 10/06/22. Per Georgina Snell @ [employer] pt needs a prescription. Printed script for provider to sign in [facility] folder on my desk. ?

## 2022-03-16 ENCOUNTER — Other Ambulatory Visit: Payer: Self-pay

## 2022-03-17 ENCOUNTER — Other Ambulatory Visit: Payer: Self-pay

## 2022-03-18 ENCOUNTER — Other Ambulatory Visit: Payer: Self-pay

## 2022-03-18 DIAGNOSIS — E039 Hypothyroidism, unspecified: Secondary | ICD-10-CM

## 2022-03-19 ENCOUNTER — Other Ambulatory Visit: Payer: Self-pay

## 2022-03-19 LAB — T3: T3, Total: 129 ng/dL (ref 71–180)

## 2022-03-19 LAB — TSH: TSH: 0.285 u[IU]/mL — ABNORMAL LOW (ref 0.450–4.500)

## 2022-03-19 LAB — T4: T4, Total: 11.7 ug/dL (ref 4.5–12.0)

## 2022-03-19 MED ORDER — PREDNISONE 20 MG PO TABS
20.0000 mg | ORAL_TABLET | Freq: Every day | ORAL | 0 refills | Status: DC
  Filled 2022-03-19: qty 5, 5d supply, fill #0

## 2022-03-19 MED ORDER — AZITHROMYCIN 250 MG PO TABS
ORAL_TABLET | ORAL | 0 refills | Status: DC
  Filled 2022-03-19: qty 6, 5d supply, fill #0

## 2022-03-23 ENCOUNTER — Other Ambulatory Visit: Payer: Self-pay

## 2022-03-23 MED ORDER — MONTELUKAST SODIUM 10 MG PO TABS
10.0000 mg | ORAL_TABLET | Freq: Every day | ORAL | 11 refills | Status: DC
  Filled 2022-03-23: qty 90, 90d supply, fill #0

## 2022-03-23 MED ORDER — FLUTICASONE PROPIONATE 50 MCG/ACT NA SUSP
NASAL | 11 refills | Status: DC
  Filled 2022-03-23: qty 16, 60d supply, fill #0
  Filled 2022-04-30: qty 16, 30d supply, fill #0
  Filled 2022-04-30: qty 16, fill #0

## 2022-03-25 ENCOUNTER — Ambulatory Visit: Payer: Self-pay | Admitting: Internal Medicine

## 2022-03-25 ENCOUNTER — Other Ambulatory Visit: Payer: Self-pay

## 2022-03-25 ENCOUNTER — Encounter: Payer: Self-pay | Admitting: Internal Medicine

## 2022-03-25 VITALS — BP 144/61 | Temp 98.1°F | Ht 64.0 in | Wt 244.6 lb

## 2022-03-25 DIAGNOSIS — I152 Hypertension secondary to endocrine disorders: Secondary | ICD-10-CM

## 2022-03-25 DIAGNOSIS — E039 Hypothyroidism, unspecified: Secondary | ICD-10-CM

## 2022-03-25 DIAGNOSIS — J449 Chronic obstructive pulmonary disease, unspecified: Secondary | ICD-10-CM

## 2022-03-25 MED ORDER — LEVOTHYROXINE SODIUM 125 MCG PO TABS
125.0000 ug | ORAL_TABLET | Freq: Every day | ORAL | 3 refills | Status: AC
  Filled 2022-03-25: qty 90, 90d supply, fill #0
  Filled 2022-06-20: qty 90, 90d supply, fill #1
  Filled 2022-06-22: qty 90, 90d supply, fill #0
  Filled 2022-06-24: qty 30, 30d supply, fill #0

## 2022-03-25 NOTE — Progress Notes (Signed)
? ?  Established Patient Office Visit ? ?Subjective   ?Patient ID: Isabel Jimenez, female    DOB: 22-Jun-1961  Age: 61 y.o. MRN: [id number] ? ?Chief Complaint  ?Patient presents with  ? Follow-up  ?  Finished steroids and antibiotics from [facility] for lower respiratory infection  ? ? ?HPI ?Isabel Jimenez is a 61 y/o female who presents for routine follow-up. She was last seen on 12/17/2021 and expressed concern about her TSH levels. Her labs 03/18/2022 showed abnormal TSH levels.  ? ?Patient expressing increased fatigue and feeling emotional. She was seen last week 03/20/2022 at [facility] urgent care for a lower respiratory infection. She was started on azithromycin and steroids which she completed 03/24/2022. She is feeling much improved.  ? ?ROS ? ?  ?Objective:  ?  ? ?BP (!) 144/61 (BP Location: Left Arm, Patient Position: Sitting, Cuff Size: Large)   Temp 98.1 ?F (36.7 ?C)   Ht '5\' 4"'$  (1.626 m)   Wt 244 lb 9.6 oz (110.9 kg)   SpO2 94%   BMI 41.99 kg/m?  ? ? ?Physical Exam ? ? ?No results found for any visits on 03/25/22. ? ? ? ?The 10-year ASCVD risk score (Arnett DK, et al., 2019) is: 8.6% ? ?  ?Assessment & Plan:  ? ?Problem List Items Addressed This Visit   ? ?  ? Cardiovascular and Mediastinum  ? Hypertension associated with diabetes ([place])  ?  ? Respiratory  ? COPD (chronic obstructive pulmonary disease) ([place])  ?  ? Endocrine  ? Hypothyroidism - Primary  ? ?1. Chronic obstructive pulmonary disease, unspecified COPD type ([place]) ?Recent flare of her "bronchitis". Treated at [facility] urgent care [place]. Clinically improved with treatment. Physical exam today finds her lungs clear with no wheezes or cough. She is feeling we;. ? ?2. Hypothyroidism, unspecified type ?Clinically feeling well. Her T4 and T3 are within normal limits. Her TSH has improved but still is below therapeutic window. Will change her Synthroid to 17mg/day. Repeat labs in 90 days. Side note is that she reported this medication dose was slightly [facility]  [facility] in the face of a normal TSH thinking it would clinically help her overall.  ? ?3. Hypertension associated with diabetes ([place] ?Blood pressure at target. No changes. ? ?Return in about 3 months (around 06/24/2022), or if symptoms worsen or fail to improve.  ? ? ?GDede Query CMA ? ?

## 2022-03-26 ENCOUNTER — Other Ambulatory Visit: Payer: Self-pay

## 2022-03-30 ENCOUNTER — Other Ambulatory Visit: Payer: Self-pay

## 2022-03-30 MED ORDER — CETIRIZINE HCL 10 MG PO TABS
10.0000 mg | ORAL_TABLET | Freq: Every day | ORAL | 2 refills | Status: DC
  Filled 2022-03-30: qty 90, 90d supply, fill #0

## 2022-03-30 MED ORDER — AMITRIPTYLINE HCL 10 MG PO TABS
ORAL_TABLET | ORAL | 3 refills | Status: DC
  Filled 2022-03-30: qty 60, 30d supply, fill #0

## 2022-04-02 ENCOUNTER — Other Ambulatory Visit: Payer: Self-pay

## 2022-04-02 MED ORDER — AMOXICILLIN-POT CLAVULANATE 875-125 MG PO TABS
1.0000 | ORAL_TABLET | Freq: Two times a day (BID) | ORAL | 0 refills | Status: DC
  Filled 2022-04-02: qty 10, 5d supply, fill #0

## 2022-04-06 ENCOUNTER — Other Ambulatory Visit: Payer: Self-pay

## 2022-04-08 ENCOUNTER — Other Ambulatory Visit: Payer: Self-pay

## 2022-04-08 MED ORDER — PREDNISONE 20 MG PO TABS
ORAL_TABLET | Freq: Every day | ORAL | 0 refills | Status: DC
Start: 2022-04-08 — End: 2022-04-17
  Filled 2022-04-08: qty 10, 5d supply, fill #0

## 2022-04-09 ENCOUNTER — Other Ambulatory Visit: Payer: Self-pay

## 2022-04-10 ENCOUNTER — Other Ambulatory Visit: Payer: Self-pay

## 2022-04-15 ENCOUNTER — Other Ambulatory Visit: Payer: Self-pay

## 2022-04-16 ENCOUNTER — Other Ambulatory Visit: Payer: Self-pay

## 2022-04-16 MED ORDER — AZITHROMYCIN 500 MG PO TABS
ORAL_TABLET | ORAL | 1 refills | Status: DC
  Filled 2022-04-16 – 2022-05-08 (×2): qty 12, 28d supply, fill #0
  Filled 2022-05-08: qty 12, 28d supply, fill #1

## 2022-04-17 ENCOUNTER — Other Ambulatory Visit: Payer: Self-pay

## 2022-04-21 ENCOUNTER — Other Ambulatory Visit: Payer: Self-pay

## 2022-04-22 ENCOUNTER — Other Ambulatory Visit: Payer: Self-pay

## 2022-04-30 ENCOUNTER — Other Ambulatory Visit: Payer: Self-pay

## 2022-05-06 ENCOUNTER — Other Ambulatory Visit: Payer: Self-pay

## 2022-05-08 ENCOUNTER — Other Ambulatory Visit: Payer: Self-pay

## 2022-05-14 ENCOUNTER — Other Ambulatory Visit: Payer: Self-pay

## 2022-05-15 ENCOUNTER — Other Ambulatory Visit: Payer: Self-pay

## 2022-05-18 ENCOUNTER — Other Ambulatory Visit: Payer: Self-pay

## 2022-05-18 MED ORDER — KETOCONAZOLE 2 % EX SHAM
MEDICATED_SHAMPOO | CUTANEOUS | 0 refills | Status: DC
Start: 2022-05-18 — End: 2022-07-01
  Filled 2022-05-18: qty 120, 30d supply, fill #0

## 2022-05-19 ENCOUNTER — Other Ambulatory Visit: Payer: Self-pay

## 2022-05-19 MED ORDER — ALBUTEROL SULFATE (2.5 MG/3ML) 0.083% IN NEBU
INHALATION_SOLUTION | RESPIRATORY_TRACT | 3 refills | Status: DC
  Filled 2022-05-19: qty 270, 21d supply, fill #0

## 2022-05-20 ENCOUNTER — Other Ambulatory Visit: Payer: Self-pay

## 2022-05-20 MED ORDER — BACLOFEN 10 MG PO TABS
ORAL_TABLET | ORAL | 0 refills | Status: DC
  Filled 2022-05-20: qty 42, 14d supply, fill #0

## 2022-05-21 ENCOUNTER — Other Ambulatory Visit: Payer: Self-pay

## 2022-05-21 MED ORDER — TRELEGY ELLIPTA 100-62.5-25 MCG/ACT IN AEPB
INHALATION_SPRAY | RESPIRATORY_TRACT | 3 refills | Status: DC
  Filled 2022-05-21: qty 180, 90d supply, fill #0

## 2022-05-22 ENCOUNTER — Other Ambulatory Visit: Payer: Self-pay

## 2022-05-28 ENCOUNTER — Other Ambulatory Visit: Payer: Self-pay

## 2022-06-11 ENCOUNTER — Other Ambulatory Visit: Payer: Self-pay

## 2022-06-22 ENCOUNTER — Other Ambulatory Visit: Payer: Self-pay

## 2022-06-24 ENCOUNTER — Other Ambulatory Visit: Payer: Self-pay

## 2022-06-24 DIAGNOSIS — E039 Hypothyroidism, unspecified: Secondary | ICD-10-CM

## 2022-06-24 NOTE — Congregational Nurse Program (Signed)
   Congregational Nurse Program Note  Date of Encounter: 06/24/2022  Dept: [phone] Past Medical History: Past Medical History:  Diagnosis Date   Asthma    Cancer ([place])    skin   COPD (chronic obstructive pulmonary disease) ([place])    GERD (gastroesophageal reflux disease)    Hyperlipidemia    Hypertension    Joint pain    Lupus ([place])    Migraines    Non-alcoholic fatty liver disease    Osteoarthritis    Thyroid disease     Encounter Details:  CNP Questionnaire - 06/24/22 1516       Questionnaire   Do you give verbal consent to treat you today? Yes    Location Patient Served  Not Applicable    Visit Setting Church or Organization    Patient Status Unknown    Medical Referral [facility]    Medical Appointment Made N/A    Food Have Food Insecurities    Transportation N/A    Housing/Utilities N/A    Interpersonal Safety N/A    Intervention [place]    ED Visit Averted N/A    Life-Saving Intervention Made N/A           Client into nurse only clinic at the [facility] requesting assistance getting eye exam and glasses as [facility] no longer has eye services. States it is getting harder to see in the distance clearly. Prescription is more than a year old and lens have crack. Completed and submitted [facility] [place] referral. Client to f/u in this clinic in ~2 wks. Also discussed medical hx. Client reports having COPD (stopped smoking years ago), thyroid disease, HTN, lupus. States up to date with follow up. PCP is [facility]; specialty services are at [facility]. Both sites provide charity care. Declines need for any screenings today. Rhermann, RN

## 2022-06-25 LAB — TSH: TSH: 3.37 u[IU]/mL (ref 0.450–4.500)

## 2022-06-25 LAB — T3: T3, Total: 110 ng/dL (ref 71–180)

## 2022-06-25 LAB — T4: T4, Total: 9.3 ug/dL (ref 4.5–12.0)

## 2022-07-01 ENCOUNTER — Encounter: Payer: Self-pay | Admitting: Gerontology

## 2022-07-01 ENCOUNTER — Ambulatory Visit: Payer: Self-pay | Admitting: Gerontology

## 2022-07-01 VITALS — BP 136/83 | HR 69 | Wt 250.6 lb

## 2022-07-01 DIAGNOSIS — E039 Hypothyroidism, unspecified: Secondary | ICD-10-CM

## 2022-07-01 DIAGNOSIS — Z0000 Encounter for general adult medical examination without abnormal findings: Secondary | ICD-10-CM | POA: Insufficient documentation

## 2022-07-01 LAB — GLUCOSE, POCT (MANUAL RESULT ENTRY): POC Glucose: 101 mg/dl — AB (ref 70–99)

## 2022-07-01 LAB — POCT GLYCOSYLATED HEMOGLOBIN (HGB A1C): Hemoglobin A1C: 6 % — AB (ref 4.0–5.6)

## 2022-07-01 NOTE — Progress Notes (Signed)
Established Patient Office Visit  Subjective   Patient ID: Isabel Jimenez, female    DOB: [birth year]  Age: 61 y.o. MRN: [id number]  No chief complaint on file.   HPI  Isabel Jimenez is a 61 y/o female who has a history of hypothyroidism, asthma, chronic bronchitis, occipital neuralgia presents for routine follow up and lab review. Her TSH done on 06/24/22 was 3.370 and she continues on 125 mcg of levothyroxine. She is compliant with her medications, denies side effects and continues to make healthy lifestyle changes. Her HgbA1c checked during visit was 6% and blood glucose was 101 mg/dl, she states that she will continue to adhere to ADA diet. Her breathing is normal. She follows up at [facility] clinic for left sided facial numbness, Occipital neuralgia and [facility] for right and left trigger finger. Overall, she states that she's doing well and offers no further complaint.  Review of Systems  Constitutional: Negative.   Eyes: Negative.   Respiratory:  Positive for shortness of breath.   Cardiovascular: Negative.   Psychiatric/Behavioral: Negative.        Objective:     BP 136/83 (BP Location: Right Arm, Patient Position: Sitting, Cuff Size: Large)   Pulse 69   Wt 250 lb 9.6 oz (113.7 kg)   BMI 43.02 kg/m  BP Readings from Last 3 Encounters:  07/01/22 136/83  06/24/22 137/89  03/25/22 (!) 144/61   Wt Readings from Last 3 Encounters:  07/01/22 250 lb 9.6 oz (113.7 kg)  06/24/22 251 lb 6.4 oz (114 kg)  03/25/22 244 lb 9.6 oz (110.9 kg)    Encouraged weight loss  Physical Exam HENT:     Head: Normocephalic and atraumatic.     Mouth/Throat:     Mouth: Mucous membranes are moist.  Eyes:     Extraocular Movements: Extraocular movements intact.     Conjunctiva/sclera: Conjunctivae normal.     Pupils: Pupils are equal, round, and reactive to light.  Cardiovascular:     Rate and Rhythm: Normal rate and regular rhythm.     Pulses: Normal pulses.     Heart  sounds: Normal heart sounds.  Pulmonary:     Effort: Pulmonary effort is normal.     Breath sounds: Normal breath sounds.  Skin:    General: Skin is warm.  Neurological:     General: No focal deficit present.     Mental Status: She is alert and oriented to person, place, and time. Mental status is at baseline.  Psychiatric:        Mood and Affect: Mood normal.        Behavior: Behavior normal.        Thought Content: Thought content normal.        Judgment: Judgment normal.      Results for orders placed or performed in visit on 07/01/22  POCT Glucose (CBG)  Result Value Ref Range   POC Glucose 101 (A) 70 - 99 mg/dl  POCT HgB A1C  Result Value Ref Range   Hemoglobin A1C 6.0 (A) 4.0 - 5.6 %   HbA1c POC (<> result, manual entry)     HbA1c, POC (prediabetic range)     HbA1c, POC (controlled diabetic range)      Last CBC Lab Results  Component Value Date   WBC 7.0 12/10/2021   HGB 12.4 12/10/2021   HCT 36.2 12/10/2021   MCV 89 12/10/2021   MCH 30.6 12/10/2021   RDW 14.2 12/10/2021  PLT 331 47/31/9243   Last metabolic panel Lab Results  Component Value Date   GLUCOSE 139 (H) 12/10/2021   NA 144 12/10/2021   K 4.5 12/10/2021   CL 105 12/10/2021   CO2 24 12/10/2021   BUN 13 12/10/2021   CREATININE 0.72 12/10/2021   EGFR 94 12/10/2021   CALCIUM 9.0 12/10/2021   PROT 6.6 12/10/2021   ALBUMIN 3.9 12/10/2021   LABGLOB 2.7 12/10/2021   AGRATIO 1.4 12/10/2021   BILITOT <0.2 12/10/2021   ALKPHOS 107 12/10/2021   AST 16 12/10/2021   ALT 23 12/10/2021   ANIONGAP 6 04/18/2016   Last lipids Lab Results  Component Value Date   CHOL 173 12/10/2021   HDL 57 12/10/2021   LDLCALC 90 12/10/2021   TRIG 152 (H) 12/10/2021   CHOLHDL 3.5 12/11/2020   Last hemoglobin A1c Lab Results  Component Value Date   HGBA1C 6.0 (A) 07/01/2022   Last thyroid functions Lab Results  Component Value Date   TSH 3.370 06/24/2022   T3TOTAL 110 06/24/2022   T4TOTAL 9.3 06/24/2022       The 10-year ASCVD risk score (Arnett DK, et al., 2019) is: 8.6%    Assessment & Plan:   1. Health care maintenance -Her hemoglobin A1c was stable at 6%, she states that she will continue dietary modification.  She was advised to continue on low carbohydrate/no concentrated sweet diet and exercise as tolerated. - POCT HgB A1C; Future - POCT Glucose (CBG); Future - POCT Glucose (CBG) - POCT HgB A1C  2. Hypothyroidism, unspecified type -Her TSH is euthyroid, she will continue on current dose of medication.  Was advised to notify clinic for worsening symptoms.   Return in about 13 weeks (around 09/30/2022), or if symptoms worsen or fail to improve.    Saesha Llerenas Jerold Coombe, NP

## 2022-07-20 ENCOUNTER — Other Ambulatory Visit: Payer: Self-pay

## 2022-09-30 ENCOUNTER — Ambulatory Visit: Payer: Self-pay | Admitting: Gerontology

## 2022-10-19 ENCOUNTER — Other Ambulatory Visit: Payer: Self-pay

## 2023-01-06 DIAGNOSIS — K760 Fatty (change of) liver, not elsewhere classified: Secondary | ICD-10-CM | POA: Diagnosis not present

## 2023-01-06 DIAGNOSIS — R109 Unspecified abdominal pain: Secondary | ICD-10-CM | POA: Diagnosis not present

## 2023-01-06 DIAGNOSIS — R9389 Abnormal findings on diagnostic imaging of other specified body structures: Secondary | ICD-10-CM | POA: Diagnosis not present

## 2023-03-16 DIAGNOSIS — M5481 Occipital neuralgia: Secondary | ICD-10-CM | POA: Diagnosis not present

## 2023-03-22 DIAGNOSIS — Z006 Encounter for examination for normal comparison and control in clinical research program: Secondary | ICD-10-CM | POA: Diagnosis not present

## 2023-03-22 DIAGNOSIS — D849 Immunodeficiency, unspecified: Secondary | ICD-10-CM | POA: Diagnosis not present

## 2023-03-22 DIAGNOSIS — M3501 Sicca syndrome with keratoconjunctivitis: Secondary | ICD-10-CM | POA: Diagnosis not present

## 2023-03-22 DIAGNOSIS — Z6841 Body Mass Index (BMI) 40.0 and over, adult: Secondary | ICD-10-CM | POA: Diagnosis not present

## 2023-03-22 DIAGNOSIS — K219 Gastro-esophageal reflux disease without esophagitis: Secondary | ICD-10-CM | POA: Diagnosis not present

## 2023-03-22 DIAGNOSIS — L932 Other local lupus erythematosus: Secondary | ICD-10-CM | POA: Diagnosis not present

## 2023-03-22 DIAGNOSIS — J4489 Other specified chronic obstructive pulmonary disease: Secondary | ICD-10-CM | POA: Diagnosis not present

## 2023-03-30 DIAGNOSIS — L932 Other local lupus erythematosus: Secondary | ICD-10-CM | POA: Diagnosis not present

## 2023-03-30 DIAGNOSIS — K219 Gastro-esophageal reflux disease without esophagitis: Secondary | ICD-10-CM | POA: Diagnosis not present

## 2023-03-30 DIAGNOSIS — E039 Hypothyroidism, unspecified: Secondary | ICD-10-CM | POA: Diagnosis not present

## 2023-03-30 DIAGNOSIS — M3501 Sicca syndrome with keratoconjunctivitis: Secondary | ICD-10-CM | POA: Diagnosis not present

## 2023-03-30 DIAGNOSIS — G4733 Obstructive sleep apnea (adult) (pediatric): Secondary | ICD-10-CM | POA: Diagnosis not present

## 2023-03-30 DIAGNOSIS — J4489 Other specified chronic obstructive pulmonary disease: Secondary | ICD-10-CM | POA: Diagnosis not present

## 2023-04-05 DIAGNOSIS — N840 Polyp of corpus uteri: Secondary | ICD-10-CM | POA: Diagnosis not present

## 2023-04-05 DIAGNOSIS — R9389 Abnormal findings on diagnostic imaging of other specified body structures: Secondary | ICD-10-CM | POA: Diagnosis not present

## 2023-04-05 DIAGNOSIS — J449 Chronic obstructive pulmonary disease, unspecified: Secondary | ICD-10-CM | POA: Diagnosis not present

## 2023-04-05 DIAGNOSIS — K219 Gastro-esophageal reflux disease without esophagitis: Secondary | ICD-10-CM | POA: Diagnosis not present

## 2023-04-05 DIAGNOSIS — Z7951 Long term (current) use of inhaled steroids: Secondary | ICD-10-CM | POA: Diagnosis not present

## 2023-04-05 DIAGNOSIS — G4733 Obstructive sleep apnea (adult) (pediatric): Secondary | ICD-10-CM | POA: Diagnosis not present

## 2023-04-05 DIAGNOSIS — Z800 Family history of malignant neoplasm of digestive organs: Secondary | ICD-10-CM | POA: Diagnosis not present

## 2023-04-05 DIAGNOSIS — I10 Essential (primary) hypertension: Secondary | ICD-10-CM | POA: Diagnosis not present

## 2023-04-05 DIAGNOSIS — D071 Carcinoma in situ of vulva: Secondary | ICD-10-CM | POA: Diagnosis not present

## 2023-04-05 DIAGNOSIS — N950 Postmenopausal bleeding: Secondary | ICD-10-CM | POA: Diagnosis not present

## 2023-04-05 DIAGNOSIS — Z881 Allergy status to other antibiotic agents status: Secondary | ICD-10-CM | POA: Diagnosis not present

## 2023-04-05 DIAGNOSIS — Z6841 Body Mass Index (BMI) 40.0 and over, adult: Secondary | ICD-10-CM | POA: Diagnosis not present

## 2023-04-05 DIAGNOSIS — E669 Obesity, unspecified: Secondary | ICD-10-CM | POA: Diagnosis not present

## 2023-04-05 DIAGNOSIS — N9089 Other specified noninflammatory disorders of vulva and perineum: Secondary | ICD-10-CM | POA: Diagnosis not present

## 2023-04-05 DIAGNOSIS — Z87891 Personal history of nicotine dependence: Secondary | ICD-10-CM | POA: Diagnosis not present

## 2023-05-04 DIAGNOSIS — K635 Polyp of colon: Secondary | ICD-10-CM | POA: Diagnosis not present

## 2023-05-04 DIAGNOSIS — G4733 Obstructive sleep apnea (adult) (pediatric): Secondary | ICD-10-CM | POA: Diagnosis not present

## 2023-05-04 DIAGNOSIS — Z6841 Body Mass Index (BMI) 40.0 and over, adult: Secondary | ICD-10-CM | POA: Diagnosis not present

## 2023-05-04 DIAGNOSIS — K5730 Diverticulosis of large intestine without perforation or abscess without bleeding: Secondary | ICD-10-CM | POA: Diagnosis not present

## 2023-05-04 DIAGNOSIS — D122 Benign neoplasm of ascending colon: Secondary | ICD-10-CM | POA: Diagnosis not present

## 2023-05-04 DIAGNOSIS — Z800 Family history of malignant neoplasm of digestive organs: Secondary | ICD-10-CM | POA: Diagnosis not present

## 2023-05-04 DIAGNOSIS — E039 Hypothyroidism, unspecified: Secondary | ICD-10-CM | POA: Diagnosis not present

## 2023-05-04 DIAGNOSIS — I10 Essential (primary) hypertension: Secondary | ICD-10-CM | POA: Diagnosis not present

## 2023-05-04 DIAGNOSIS — L931 Subacute cutaneous lupus erythematosus: Secondary | ICD-10-CM | POA: Diagnosis not present

## 2023-05-04 DIAGNOSIS — Z79899 Other long term (current) drug therapy: Secondary | ICD-10-CM | POA: Diagnosis not present

## 2023-05-04 DIAGNOSIS — J449 Chronic obstructive pulmonary disease, unspecified: Secondary | ICD-10-CM | POA: Diagnosis not present

## 2023-05-04 DIAGNOSIS — K760 Fatty (change of) liver, not elsewhere classified: Secondary | ICD-10-CM | POA: Diagnosis not present

## 2023-05-04 DIAGNOSIS — K449 Diaphragmatic hernia without obstruction or gangrene: Secondary | ICD-10-CM | POA: Diagnosis not present

## 2023-05-04 DIAGNOSIS — M3501 Sicca syndrome with keratoconjunctivitis: Secondary | ICD-10-CM | POA: Diagnosis not present

## 2023-05-04 DIAGNOSIS — Z86010 Personal history of colonic polyps: Secondary | ICD-10-CM | POA: Diagnosis not present

## 2023-05-04 DIAGNOSIS — R1310 Dysphagia, unspecified: Secondary | ICD-10-CM | POA: Diagnosis not present

## 2023-05-04 DIAGNOSIS — K219 Gastro-esophageal reflux disease without esophagitis: Secondary | ICD-10-CM | POA: Diagnosis not present

## 2023-05-04 DIAGNOSIS — R12 Heartburn: Secondary | ICD-10-CM | POA: Diagnosis not present

## 2023-05-04 DIAGNOSIS — Z1211 Encounter for screening for malignant neoplasm of colon: Secondary | ICD-10-CM | POA: Diagnosis not present

## 2023-05-04 DIAGNOSIS — Z79890 Hormone replacement therapy (postmenopausal): Secondary | ICD-10-CM | POA: Diagnosis not present

## 2023-05-04 DIAGNOSIS — Z885 Allergy status to narcotic agent status: Secondary | ICD-10-CM | POA: Diagnosis not present

## 2023-05-04 DIAGNOSIS — Z9989 Dependence on other enabling machines and devices: Secondary | ICD-10-CM | POA: Diagnosis not present

## 2023-05-04 DIAGNOSIS — Z85828 Personal history of other malignant neoplasm of skin: Secondary | ICD-10-CM | POA: Diagnosis not present

## 2023-05-04 DIAGNOSIS — D124 Benign neoplasm of descending colon: Secondary | ICD-10-CM | POA: Diagnosis not present

## 2023-05-04 DIAGNOSIS — E785 Hyperlipidemia, unspecified: Secondary | ICD-10-CM | POA: Diagnosis not present

## 2023-05-05 DIAGNOSIS — D124 Benign neoplasm of descending colon: Secondary | ICD-10-CM | POA: Diagnosis not present

## 2023-05-05 DIAGNOSIS — D122 Benign neoplasm of ascending colon: Secondary | ICD-10-CM | POA: Diagnosis not present

## 2023-05-17 DIAGNOSIS — M5442 Lumbago with sciatica, left side: Secondary | ICD-10-CM | POA: Diagnosis not present

## 2023-05-17 DIAGNOSIS — X58XXXA Exposure to other specified factors, initial encounter: Secondary | ICD-10-CM | POA: Diagnosis not present

## 2023-05-17 DIAGNOSIS — S39012A Strain of muscle, fascia and tendon of lower back, initial encounter: Secondary | ICD-10-CM | POA: Diagnosis not present

## 2023-05-19 DIAGNOSIS — D071 Carcinoma in situ of vulva: Secondary | ICD-10-CM | POA: Diagnosis not present

## 2023-05-20 DIAGNOSIS — J449 Chronic obstructive pulmonary disease, unspecified: Secondary | ICD-10-CM | POA: Diagnosis not present

## 2023-05-20 DIAGNOSIS — G4733 Obstructive sleep apnea (adult) (pediatric): Secondary | ICD-10-CM | POA: Diagnosis not present

## 2023-05-20 DIAGNOSIS — J441 Chronic obstructive pulmonary disease with (acute) exacerbation: Secondary | ICD-10-CM | POA: Diagnosis not present

## 2023-06-08 DIAGNOSIS — D071 Carcinoma in situ of vulva: Secondary | ICD-10-CM | POA: Diagnosis not present

## 2023-06-24 DIAGNOSIS — D071 Carcinoma in situ of vulva: Secondary | ICD-10-CM | POA: Diagnosis not present

## 2023-06-24 DIAGNOSIS — G4733 Obstructive sleep apnea (adult) (pediatric): Secondary | ICD-10-CM | POA: Diagnosis not present

## 2023-06-24 DIAGNOSIS — Z885 Allergy status to narcotic agent status: Secondary | ICD-10-CM | POA: Diagnosis not present

## 2023-06-24 DIAGNOSIS — Z79899 Other long term (current) drug therapy: Secondary | ICD-10-CM | POA: Diagnosis not present

## 2023-06-24 DIAGNOSIS — Z881 Allergy status to other antibiotic agents status: Secondary | ICD-10-CM | POA: Diagnosis not present

## 2023-06-24 DIAGNOSIS — Z87891 Personal history of nicotine dependence: Secondary | ICD-10-CM | POA: Diagnosis not present

## 2023-06-24 DIAGNOSIS — E785 Hyperlipidemia, unspecified: Secondary | ICD-10-CM | POA: Diagnosis not present

## 2023-06-24 DIAGNOSIS — E669 Obesity, unspecified: Secondary | ICD-10-CM | POA: Diagnosis not present

## 2023-06-24 DIAGNOSIS — Z6841 Body Mass Index (BMI) 40.0 and over, adult: Secondary | ICD-10-CM | POA: Diagnosis not present

## 2023-06-24 DIAGNOSIS — J449 Chronic obstructive pulmonary disease, unspecified: Secondary | ICD-10-CM | POA: Diagnosis not present

## 2023-06-24 DIAGNOSIS — Z79890 Hormone replacement therapy (postmenopausal): Secondary | ICD-10-CM | POA: Diagnosis not present

## 2023-06-29 DIAGNOSIS — T8131XA Disruption of external operation (surgical) wound, not elsewhere classified, initial encounter: Secondary | ICD-10-CM | POA: Diagnosis not present

## 2023-07-01 DIAGNOSIS — J449 Chronic obstructive pulmonary disease, unspecified: Secondary | ICD-10-CM | POA: Diagnosis not present

## 2023-07-01 DIAGNOSIS — Z87891 Personal history of nicotine dependence: Secondary | ICD-10-CM | POA: Diagnosis not present

## 2023-07-01 DIAGNOSIS — T8131XA Disruption of external operation (surgical) wound, not elsewhere classified, initial encounter: Secondary | ICD-10-CM | POA: Diagnosis not present

## 2023-07-01 DIAGNOSIS — I10 Essential (primary) hypertension: Secondary | ICD-10-CM | POA: Diagnosis not present

## 2023-07-01 DIAGNOSIS — T8130XA Disruption of wound, unspecified, initial encounter: Secondary | ICD-10-CM | POA: Diagnosis not present

## 2023-07-01 DIAGNOSIS — E039 Hypothyroidism, unspecified: Secondary | ICD-10-CM | POA: Diagnosis not present

## 2023-07-01 DIAGNOSIS — T8132XA Disruption of internal operation (surgical) wound, not elsewhere classified, initial encounter: Secondary | ICD-10-CM | POA: Diagnosis not present

## 2023-07-21 DIAGNOSIS — M5481 Occipital neuralgia: Secondary | ICD-10-CM | POA: Diagnosis not present

## 2023-07-28 DIAGNOSIS — T8130XA Disruption of wound, unspecified, initial encounter: Secondary | ICD-10-CM | POA: Diagnosis not present

## 2023-08-11 DIAGNOSIS — J4489 Other specified chronic obstructive pulmonary disease: Secondary | ICD-10-CM | POA: Diagnosis not present

## 2023-08-11 DIAGNOSIS — J4551 Severe persistent asthma with (acute) exacerbation: Secondary | ICD-10-CM | POA: Diagnosis not present

## 2023-08-11 DIAGNOSIS — Z87891 Personal history of nicotine dependence: Secondary | ICD-10-CM | POA: Diagnosis not present

## 2023-08-17 DIAGNOSIS — M3501 Sicca syndrome with keratoconjunctivitis: Secondary | ICD-10-CM | POA: Diagnosis not present

## 2023-08-17 DIAGNOSIS — L932 Other local lupus erythematosus: Secondary | ICD-10-CM | POA: Diagnosis not present

## 2023-08-18 DIAGNOSIS — G4733 Obstructive sleep apnea (adult) (pediatric): Secondary | ICD-10-CM | POA: Diagnosis not present

## 2023-08-18 DIAGNOSIS — J449 Chronic obstructive pulmonary disease, unspecified: Secondary | ICD-10-CM | POA: Diagnosis not present

## 2023-08-18 DIAGNOSIS — J441 Chronic obstructive pulmonary disease with (acute) exacerbation: Secondary | ICD-10-CM | POA: Diagnosis not present

## 2023-08-24 DIAGNOSIS — J449 Chronic obstructive pulmonary disease, unspecified: Secondary | ICD-10-CM | POA: Diagnosis not present

## 2023-08-24 DIAGNOSIS — E669 Obesity, unspecified: Secondary | ICD-10-CM | POA: Diagnosis not present

## 2023-08-24 DIAGNOSIS — Z79890 Hormone replacement therapy (postmenopausal): Secondary | ICD-10-CM | POA: Diagnosis not present

## 2023-08-24 DIAGNOSIS — R0602 Shortness of breath: Secondary | ICD-10-CM | POA: Diagnosis not present

## 2023-08-24 DIAGNOSIS — G4730 Sleep apnea, unspecified: Secondary | ICD-10-CM | POA: Diagnosis not present

## 2023-08-24 DIAGNOSIS — Z87891 Personal history of nicotine dependence: Secondary | ICD-10-CM | POA: Diagnosis not present

## 2023-08-24 DIAGNOSIS — Z888 Allergy status to other drugs, medicaments and biological substances status: Secondary | ICD-10-CM | POA: Diagnosis not present

## 2023-08-24 DIAGNOSIS — K219 Gastro-esophageal reflux disease without esophagitis: Secondary | ICD-10-CM | POA: Diagnosis not present

## 2023-08-24 DIAGNOSIS — R0789 Other chest pain: Secondary | ICD-10-CM | POA: Diagnosis not present

## 2023-08-24 DIAGNOSIS — I10 Essential (primary) hypertension: Secondary | ICD-10-CM | POA: Diagnosis not present

## 2023-08-24 DIAGNOSIS — R002 Palpitations: Secondary | ICD-10-CM | POA: Diagnosis not present

## 2023-08-24 DIAGNOSIS — Z7951 Long term (current) use of inhaled steroids: Secondary | ICD-10-CM | POA: Diagnosis not present

## 2023-08-24 DIAGNOSIS — R000 Tachycardia, unspecified: Secondary | ICD-10-CM | POA: Diagnosis not present

## 2023-08-24 DIAGNOSIS — Z8673 Personal history of transient ischemic attack (TIA), and cerebral infarction without residual deficits: Secondary | ICD-10-CM | POA: Diagnosis not present

## 2023-08-24 DIAGNOSIS — R079 Chest pain, unspecified: Secondary | ICD-10-CM | POA: Diagnosis not present

## 2023-08-24 DIAGNOSIS — Z85828 Personal history of other malignant neoplasm of skin: Secondary | ICD-10-CM | POA: Diagnosis not present

## 2023-08-24 DIAGNOSIS — E039 Hypothyroidism, unspecified: Secondary | ICD-10-CM | POA: Diagnosis not present

## 2023-08-24 DIAGNOSIS — Z885 Allergy status to narcotic agent status: Secondary | ICD-10-CM | POA: Diagnosis not present

## 2023-08-24 DIAGNOSIS — E785 Hyperlipidemia, unspecified: Secondary | ICD-10-CM | POA: Diagnosis not present

## 2023-08-24 DIAGNOSIS — Z881 Allergy status to other antibiotic agents status: Secondary | ICD-10-CM | POA: Diagnosis not present

## 2023-08-24 DIAGNOSIS — J9811 Atelectasis: Secondary | ICD-10-CM | POA: Diagnosis not present

## 2023-08-24 DIAGNOSIS — Z79899 Other long term (current) drug therapy: Secondary | ICD-10-CM | POA: Diagnosis not present

## 2023-09-01 DIAGNOSIS — I493 Ventricular premature depolarization: Secondary | ICD-10-CM | POA: Diagnosis not present

## 2023-09-01 DIAGNOSIS — R0609 Other forms of dyspnea: Secondary | ICD-10-CM | POA: Diagnosis not present

## 2023-09-01 DIAGNOSIS — R0789 Other chest pain: Secondary | ICD-10-CM | POA: Diagnosis not present

## 2023-09-01 DIAGNOSIS — R002 Palpitations: Secondary | ICD-10-CM | POA: Diagnosis not present

## 2023-09-03 DIAGNOSIS — G4733 Obstructive sleep apnea (adult) (pediatric): Secondary | ICD-10-CM | POA: Diagnosis not present

## 2023-09-03 DIAGNOSIS — J441 Chronic obstructive pulmonary disease with (acute) exacerbation: Secondary | ICD-10-CM | POA: Diagnosis not present

## 2023-09-03 DIAGNOSIS — J449 Chronic obstructive pulmonary disease, unspecified: Secondary | ICD-10-CM | POA: Diagnosis not present

## 2023-10-07 DIAGNOSIS — J069 Acute upper respiratory infection, unspecified: Secondary | ICD-10-CM | POA: Diagnosis not present

## 2023-10-07 DIAGNOSIS — R102 Pelvic and perineal pain: Secondary | ICD-10-CM | POA: Diagnosis not present

## 2023-10-19 DIAGNOSIS — K3189 Other diseases of stomach and duodenum: Secondary | ICD-10-CM | POA: Diagnosis not present

## 2023-10-19 DIAGNOSIS — R102 Pelvic and perineal pain: Secondary | ICD-10-CM | POA: Diagnosis not present

## 2023-10-19 DIAGNOSIS — K5730 Diverticulosis of large intestine without perforation or abscess without bleeding: Secondary | ICD-10-CM | POA: Diagnosis not present

## 2023-10-19 DIAGNOSIS — N3289 Other specified disorders of bladder: Secondary | ICD-10-CM | POA: Diagnosis not present

## 2023-10-25 DIAGNOSIS — M5481 Occipital neuralgia: Secondary | ICD-10-CM | POA: Diagnosis not present

## 2023-10-29 DIAGNOSIS — G8929 Other chronic pain: Secondary | ICD-10-CM | POA: Diagnosis not present

## 2023-10-29 DIAGNOSIS — R109 Unspecified abdominal pain: Secondary | ICD-10-CM | POA: Diagnosis not present

## 2023-11-09 DIAGNOSIS — Z87891 Personal history of nicotine dependence: Secondary | ICD-10-CM | POA: Diagnosis not present

## 2023-11-09 DIAGNOSIS — Z122 Encounter for screening for malignant neoplasm of respiratory organs: Secondary | ICD-10-CM | POA: Diagnosis not present

## 2023-11-17 DIAGNOSIS — G4733 Obstructive sleep apnea (adult) (pediatric): Secondary | ICD-10-CM | POA: Diagnosis not present

## 2023-11-24 DIAGNOSIS — N329 Bladder disorder, unspecified: Secondary | ICD-10-CM | POA: Diagnosis not present

## 2023-11-24 DIAGNOSIS — R935 Abnormal findings on diagnostic imaging of other abdominal regions, including retroperitoneum: Secondary | ICD-10-CM | POA: Diagnosis not present

## 2023-11-24 DIAGNOSIS — R9341 Abnormal radiologic findings on diagnostic imaging of renal pelvis, ureter, or bladder: Secondary | ICD-10-CM | POA: Diagnosis not present

## 2023-11-26 DIAGNOSIS — Z23 Encounter for immunization: Secondary | ICD-10-CM | POA: Diagnosis not present

## 2023-11-26 DIAGNOSIS — E785 Hyperlipidemia, unspecified: Secondary | ICD-10-CM | POA: Diagnosis not present

## 2023-11-26 DIAGNOSIS — Z0000 Encounter for general adult medical examination without abnormal findings: Secondary | ICD-10-CM | POA: Diagnosis not present

## 2023-11-26 DIAGNOSIS — Z131 Encounter for screening for diabetes mellitus: Secondary | ICD-10-CM | POA: Diagnosis not present

## 2023-11-26 DIAGNOSIS — Z1239 Encounter for other screening for malignant neoplasm of breast: Secondary | ICD-10-CM | POA: Diagnosis not present

## 2023-12-06 DIAGNOSIS — Z1239 Encounter for other screening for malignant neoplasm of breast: Secondary | ICD-10-CM | POA: Diagnosis not present

## 2023-12-06 DIAGNOSIS — Z1231 Encounter for screening mammogram for malignant neoplasm of breast: Secondary | ICD-10-CM | POA: Diagnosis not present

## 2023-12-15 DIAGNOSIS — J449 Chronic obstructive pulmonary disease, unspecified: Secondary | ICD-10-CM | POA: Diagnosis not present

## 2023-12-15 DIAGNOSIS — N3289 Other specified disorders of bladder: Secondary | ICD-10-CM | POA: Diagnosis not present

## 2023-12-15 DIAGNOSIS — R310 Gross hematuria: Secondary | ICD-10-CM | POA: Diagnosis not present

## 2023-12-15 DIAGNOSIS — N329 Bladder disorder, unspecified: Secondary | ICD-10-CM | POA: Diagnosis not present

## 2023-12-21 DIAGNOSIS — J4489 Other specified chronic obstructive pulmonary disease: Secondary | ICD-10-CM | POA: Diagnosis not present

## 2023-12-21 DIAGNOSIS — K219 Gastro-esophageal reflux disease without esophagitis: Secondary | ICD-10-CM | POA: Diagnosis not present

## 2023-12-21 DIAGNOSIS — Z01818 Encounter for other preprocedural examination: Secondary | ICD-10-CM | POA: Diagnosis not present

## 2023-12-21 DIAGNOSIS — E039 Hypothyroidism, unspecified: Secondary | ICD-10-CM | POA: Diagnosis not present

## 2023-12-21 DIAGNOSIS — I10 Essential (primary) hypertension: Secondary | ICD-10-CM | POA: Diagnosis not present

## 2024-01-03 DIAGNOSIS — M5481 Occipital neuralgia: Secondary | ICD-10-CM | POA: Diagnosis not present

## 2024-01-07 DIAGNOSIS — D494 Neoplasm of unspecified behavior of bladder: Secondary | ICD-10-CM | POA: Diagnosis not present

## 2024-01-07 DIAGNOSIS — C679 Malignant neoplasm of bladder, unspecified: Secondary | ICD-10-CM | POA: Diagnosis not present

## 2024-01-07 DIAGNOSIS — N3289 Other specified disorders of bladder: Secondary | ICD-10-CM | POA: Diagnosis not present

## 2024-01-07 DIAGNOSIS — I10 Essential (primary) hypertension: Secondary | ICD-10-CM | POA: Diagnosis not present

## 2024-01-13 DIAGNOSIS — C679 Malignant neoplasm of bladder, unspecified: Secondary | ICD-10-CM | POA: Diagnosis not present

## 2024-01-21 DIAGNOSIS — J40 Bronchitis, not specified as acute or chronic: Secondary | ICD-10-CM | POA: Diagnosis not present

## 2024-01-21 DIAGNOSIS — R39198 Other difficulties with micturition: Secondary | ICD-10-CM | POA: Diagnosis not present

## 2024-01-21 DIAGNOSIS — J069 Acute upper respiratory infection, unspecified: Secondary | ICD-10-CM | POA: Diagnosis not present

## 2024-01-21 DIAGNOSIS — R059 Cough, unspecified: Secondary | ICD-10-CM | POA: Diagnosis not present

## 2024-01-31 DIAGNOSIS — K219 Gastro-esophageal reflux disease without esophagitis: Secondary | ICD-10-CM | POA: Diagnosis not present

## 2024-01-31 DIAGNOSIS — R935 Abnormal findings on diagnostic imaging of other abdominal regions, including retroperitoneum: Secondary | ICD-10-CM | POA: Diagnosis not present

## 2024-02-03 DIAGNOSIS — E669 Obesity, unspecified: Secondary | ICD-10-CM | POA: Diagnosis not present

## 2024-02-03 DIAGNOSIS — Z880 Allergy status to penicillin: Secondary | ICD-10-CM | POA: Diagnosis not present

## 2024-02-03 DIAGNOSIS — K760 Fatty (change of) liver, not elsewhere classified: Secondary | ICD-10-CM | POA: Diagnosis not present

## 2024-02-03 DIAGNOSIS — K219 Gastro-esophageal reflux disease without esophagitis: Secondary | ICD-10-CM | POA: Diagnosis not present

## 2024-02-03 DIAGNOSIS — E785 Hyperlipidemia, unspecified: Secondary | ICD-10-CM | POA: Diagnosis not present

## 2024-02-03 DIAGNOSIS — I10 Essential (primary) hypertension: Secondary | ICD-10-CM | POA: Diagnosis not present

## 2024-02-03 DIAGNOSIS — J449 Chronic obstructive pulmonary disease, unspecified: Secondary | ICD-10-CM | POA: Diagnosis not present

## 2024-02-03 DIAGNOSIS — K8689 Other specified diseases of pancreas: Secondary | ICD-10-CM | POA: Diagnosis not present

## 2024-02-03 DIAGNOSIS — J4489 Other specified chronic obstructive pulmonary disease: Secondary | ICD-10-CM | POA: Diagnosis not present

## 2024-02-03 DIAGNOSIS — E039 Hypothyroidism, unspecified: Secondary | ICD-10-CM | POA: Diagnosis not present

## 2024-02-03 DIAGNOSIS — Z885 Allergy status to narcotic agent status: Secondary | ICD-10-CM | POA: Diagnosis not present

## 2024-02-03 DIAGNOSIS — E119 Type 2 diabetes mellitus without complications: Secondary | ICD-10-CM | POA: Diagnosis not present

## 2024-02-03 DIAGNOSIS — R932 Abnormal findings on diagnostic imaging of liver and biliary tract: Secondary | ICD-10-CM | POA: Diagnosis not present

## 2024-02-03 DIAGNOSIS — Z888 Allergy status to other drugs, medicaments and biological substances status: Secondary | ICD-10-CM | POA: Diagnosis not present

## 2024-02-03 DIAGNOSIS — Z79899 Other long term (current) drug therapy: Secondary | ICD-10-CM | POA: Diagnosis not present

## 2024-02-03 DIAGNOSIS — G4730 Sleep apnea, unspecified: Secondary | ICD-10-CM | POA: Diagnosis not present

## 2024-02-03 DIAGNOSIS — K7689 Other specified diseases of liver: Secondary | ICD-10-CM | POA: Diagnosis not present

## 2024-02-03 DIAGNOSIS — R935 Abnormal findings on diagnostic imaging of other abdominal regions, including retroperitoneum: Secondary | ICD-10-CM | POA: Diagnosis not present

## 2024-02-06 DIAGNOSIS — R0602 Shortness of breath: Secondary | ICD-10-CM | POA: Diagnosis not present

## 2024-02-06 DIAGNOSIS — E039 Hypothyroidism, unspecified: Secondary | ICD-10-CM | POA: Diagnosis not present

## 2024-02-06 DIAGNOSIS — Z87891 Personal history of nicotine dependence: Secondary | ICD-10-CM | POA: Diagnosis not present

## 2024-02-06 DIAGNOSIS — Z885 Allergy status to narcotic agent status: Secondary | ICD-10-CM | POA: Diagnosis not present

## 2024-02-06 DIAGNOSIS — I10 Essential (primary) hypertension: Secondary | ICD-10-CM | POA: Diagnosis not present

## 2024-02-06 DIAGNOSIS — R0600 Dyspnea, unspecified: Secondary | ICD-10-CM | POA: Diagnosis not present

## 2024-02-06 DIAGNOSIS — E785 Hyperlipidemia, unspecified: Secondary | ICD-10-CM | POA: Diagnosis not present

## 2024-02-06 DIAGNOSIS — R0609 Other forms of dyspnea: Secondary | ICD-10-CM | POA: Diagnosis not present

## 2024-02-06 DIAGNOSIS — Z79890 Hormone replacement therapy (postmenopausal): Secondary | ICD-10-CM | POA: Diagnosis not present

## 2024-02-16 DIAGNOSIS — L932 Other local lupus erythematosus: Secondary | ICD-10-CM | POA: Diagnosis not present

## 2024-02-17 DIAGNOSIS — G4733 Obstructive sleep apnea (adult) (pediatric): Secondary | ICD-10-CM | POA: Diagnosis not present

## 2024-02-17 DIAGNOSIS — J449 Chronic obstructive pulmonary disease, unspecified: Secondary | ICD-10-CM | POA: Diagnosis not present

## 2024-02-17 DIAGNOSIS — J441 Chronic obstructive pulmonary disease with (acute) exacerbation: Secondary | ICD-10-CM | POA: Diagnosis not present

## 2024-03-01 DIAGNOSIS — K219 Gastro-esophageal reflux disease without esophagitis: Secondary | ICD-10-CM | POA: Diagnosis not present

## 2024-03-02 DIAGNOSIS — K219 Gastro-esophageal reflux disease without esophagitis: Secondary | ICD-10-CM | POA: Diagnosis not present

## 2024-03-09 DIAGNOSIS — Z809 Family history of malignant neoplasm, unspecified: Secondary | ICD-10-CM | POA: Diagnosis not present

## 2024-03-09 DIAGNOSIS — S29011A Strain of muscle and tendon of front wall of thorax, initial encounter: Secondary | ICD-10-CM | POA: Diagnosis not present

## 2024-03-09 DIAGNOSIS — D071 Carcinoma in situ of vulva: Secondary | ICD-10-CM | POA: Diagnosis not present

## 2024-03-09 DIAGNOSIS — C679 Malignant neoplasm of bladder, unspecified: Secondary | ICD-10-CM | POA: Diagnosis not present

## 2024-03-10 DIAGNOSIS — K3189 Other diseases of stomach and duodenum: Secondary | ICD-10-CM | POA: Diagnosis not present

## 2024-03-10 DIAGNOSIS — K862 Cyst of pancreas: Secondary | ICD-10-CM | POA: Diagnosis not present

## 2024-03-10 DIAGNOSIS — K760 Fatty (change of) liver, not elsewhere classified: Secondary | ICD-10-CM | POA: Diagnosis not present

## 2024-03-30 DIAGNOSIS — J4489 Other specified chronic obstructive pulmonary disease: Secondary | ICD-10-CM | POA: Diagnosis not present

## 2024-04-03 DIAGNOSIS — M5481 Occipital neuralgia: Secondary | ICD-10-CM | POA: Diagnosis not present

## 2024-04-05 DIAGNOSIS — M3501 Sicca syndrome with keratoconjunctivitis: Secondary | ICD-10-CM | POA: Diagnosis not present

## 2024-04-05 DIAGNOSIS — K219 Gastro-esophageal reflux disease without esophagitis: Secondary | ICD-10-CM | POA: Diagnosis not present

## 2024-04-05 DIAGNOSIS — J4489 Other specified chronic obstructive pulmonary disease: Secondary | ICD-10-CM | POA: Diagnosis not present

## 2024-04-05 DIAGNOSIS — Z9989 Dependence on other enabling machines and devices: Secondary | ICD-10-CM | POA: Diagnosis not present

## 2024-04-05 DIAGNOSIS — E669 Obesity, unspecified: Secondary | ICD-10-CM | POA: Diagnosis not present

## 2024-04-05 DIAGNOSIS — G4733 Obstructive sleep apnea (adult) (pediatric): Secondary | ICD-10-CM | POA: Diagnosis not present

## 2024-04-17 DIAGNOSIS — M5481 Occipital neuralgia: Secondary | ICD-10-CM | POA: Diagnosis not present
